# Patient Record
Sex: Male | Born: 1955 | Race: White | Hispanic: No | Marital: Single | Smoking: Current every day smoker
Health system: Southern US, Community
[De-identification: ages and names within clinical notes are randomized; demographics above are authoritative.]

## PROBLEM LIST (undated history)

## (undated) DIAGNOSIS — F1123 Opioid dependence with withdrawal: Secondary | ICD-10-CM

## (undated) DIAGNOSIS — N200 Calculus of kidney: Secondary | ICD-10-CM

## (undated) DIAGNOSIS — F1110 Opioid abuse, uncomplicated: Secondary | ICD-10-CM

## (undated) DIAGNOSIS — F419 Anxiety disorder, unspecified: Secondary | ICD-10-CM

## (undated) DIAGNOSIS — I509 Heart failure, unspecified: Secondary | ICD-10-CM

## (undated) DIAGNOSIS — F32A Depression, unspecified: Secondary | ICD-10-CM

## (undated) DIAGNOSIS — N289 Disorder of kidney and ureter, unspecified: Secondary | ICD-10-CM

## (undated) DIAGNOSIS — F329 Major depressive disorder, single episode, unspecified: Secondary | ICD-10-CM

## (undated) DIAGNOSIS — F1010 Alcohol abuse, uncomplicated: Secondary | ICD-10-CM

## (undated) HISTORY — PX: OTHER SURGICAL HISTORY: SHX169

---

## 1998-12-21 ENCOUNTER — Encounter: Payer: Self-pay | Admitting: Emergency Medicine

## 1998-12-21 ENCOUNTER — Emergency Department (HOSPITAL_COMMUNITY): Admission: EM | Admit: 1998-12-21 | Discharge: 1998-12-21 | Payer: Self-pay | Admitting: Emergency Medicine

## 1999-01-12 ENCOUNTER — Emergency Department (HOSPITAL_COMMUNITY): Admission: EM | Admit: 1999-01-12 | Discharge: 1999-01-12 | Payer: Self-pay | Admitting: Emergency Medicine

## 1999-01-12 ENCOUNTER — Encounter: Payer: Self-pay | Admitting: Emergency Medicine

## 1999-01-26 ENCOUNTER — Encounter: Payer: Self-pay | Admitting: Emergency Medicine

## 1999-01-26 ENCOUNTER — Emergency Department (HOSPITAL_COMMUNITY): Admission: EM | Admit: 1999-01-26 | Discharge: 1999-01-26 | Payer: Self-pay | Admitting: Emergency Medicine

## 1999-12-01 ENCOUNTER — Encounter: Payer: Self-pay | Admitting: Emergency Medicine

## 1999-12-01 ENCOUNTER — Emergency Department (HOSPITAL_COMMUNITY): Admission: EM | Admit: 1999-12-01 | Discharge: 1999-12-01 | Payer: Self-pay | Admitting: Emergency Medicine

## 1999-12-15 ENCOUNTER — Emergency Department (HOSPITAL_COMMUNITY): Admission: EM | Admit: 1999-12-15 | Discharge: 1999-12-15 | Payer: Self-pay | Admitting: Emergency Medicine

## 1999-12-15 ENCOUNTER — Encounter: Payer: Self-pay | Admitting: Emergency Medicine

## 2000-10-23 ENCOUNTER — Emergency Department (HOSPITAL_COMMUNITY): Admission: EM | Admit: 2000-10-23 | Discharge: 2000-10-23 | Payer: Self-pay | Admitting: Emergency Medicine

## 2001-02-01 ENCOUNTER — Emergency Department (HOSPITAL_COMMUNITY): Admission: EM | Admit: 2001-02-01 | Discharge: 2001-02-01 | Payer: Self-pay | Admitting: Emergency Medicine

## 2001-06-12 ENCOUNTER — Emergency Department (HOSPITAL_COMMUNITY): Admission: EM | Admit: 2001-06-12 | Discharge: 2001-06-13 | Payer: Self-pay

## 2001-07-06 ENCOUNTER — Encounter: Payer: Self-pay | Admitting: Emergency Medicine

## 2001-07-06 ENCOUNTER — Emergency Department (HOSPITAL_COMMUNITY): Admission: EM | Admit: 2001-07-06 | Discharge: 2001-07-06 | Payer: Self-pay | Admitting: Emergency Medicine

## 2006-09-25 ENCOUNTER — Emergency Department (HOSPITAL_COMMUNITY): Admission: EM | Admit: 2006-09-25 | Discharge: 2006-09-25 | Payer: Self-pay | Admitting: Emergency Medicine

## 2006-10-08 ENCOUNTER — Emergency Department (HOSPITAL_COMMUNITY): Admission: EM | Admit: 2006-10-08 | Discharge: 2006-10-09 | Payer: Self-pay | Admitting: Emergency Medicine

## 2006-10-14 ENCOUNTER — Emergency Department (HOSPITAL_COMMUNITY): Admission: EM | Admit: 2006-10-14 | Discharge: 2006-10-14 | Payer: Self-pay | Admitting: Emergency Medicine

## 2006-10-20 ENCOUNTER — Emergency Department (HOSPITAL_COMMUNITY): Admission: EM | Admit: 2006-10-20 | Discharge: 2006-10-20 | Payer: Self-pay | Admitting: Emergency Medicine

## 2006-11-28 ENCOUNTER — Emergency Department (HOSPITAL_COMMUNITY): Admission: EM | Admit: 2006-11-28 | Discharge: 2006-11-29 | Payer: Self-pay | Admitting: Emergency Medicine

## 2007-03-03 ENCOUNTER — Emergency Department (HOSPITAL_COMMUNITY): Admission: EM | Admit: 2007-03-03 | Discharge: 2007-03-04 | Payer: Self-pay | Admitting: *Deleted

## 2007-03-24 ENCOUNTER — Emergency Department (HOSPITAL_COMMUNITY): Admission: EM | Admit: 2007-03-24 | Discharge: 2007-03-24 | Payer: Self-pay | Admitting: Emergency Medicine

## 2007-03-29 ENCOUNTER — Emergency Department (HOSPITAL_COMMUNITY): Admission: EM | Admit: 2007-03-29 | Discharge: 2007-03-29 | Payer: Self-pay | Admitting: Internal Medicine

## 2007-03-31 ENCOUNTER — Emergency Department (HOSPITAL_COMMUNITY): Admission: EM | Admit: 2007-03-31 | Discharge: 2007-04-01 | Payer: Self-pay | Admitting: Emergency Medicine

## 2007-04-25 ENCOUNTER — Emergency Department (HOSPITAL_COMMUNITY): Admission: EM | Admit: 2007-04-25 | Discharge: 2007-04-25 | Payer: Self-pay | Admitting: Emergency Medicine

## 2007-07-20 ENCOUNTER — Emergency Department (HOSPITAL_COMMUNITY): Admission: EM | Admit: 2007-07-20 | Discharge: 2007-07-20 | Payer: Self-pay | Admitting: Emergency Medicine

## 2007-07-28 ENCOUNTER — Emergency Department (HOSPITAL_COMMUNITY): Admission: EM | Admit: 2007-07-28 | Discharge: 2007-07-29 | Payer: Self-pay | Admitting: Emergency Medicine

## 2007-09-02 ENCOUNTER — Emergency Department (HOSPITAL_COMMUNITY): Admission: EM | Admit: 2007-09-02 | Discharge: 2007-09-02 | Payer: Self-pay | Admitting: Emergency Medicine

## 2007-09-07 ENCOUNTER — Emergency Department: Payer: Self-pay | Admitting: Emergency Medicine

## 2007-09-11 ENCOUNTER — Emergency Department (HOSPITAL_COMMUNITY): Admission: EM | Admit: 2007-09-11 | Discharge: 2007-09-11 | Payer: Self-pay | Admitting: Emergency Medicine

## 2007-10-11 ENCOUNTER — Emergency Department (HOSPITAL_COMMUNITY): Admission: EM | Admit: 2007-10-11 | Discharge: 2007-10-11 | Payer: Self-pay | Admitting: Emergency Medicine

## 2011-05-29 ENCOUNTER — Emergency Department (HOSPITAL_COMMUNITY)
Admission: EM | Admit: 2011-05-29 | Discharge: 2011-05-29 | Disposition: A | Discharge status: Home / Self Care | Payer: Self-pay | Attending: Emergency Medicine | Admitting: Emergency Medicine

## 2011-05-29 ENCOUNTER — Emergency Department (HOSPITAL_COMMUNITY): Payer: Self-pay

## 2011-05-29 DIAGNOSIS — X500XXA Overexertion from strenuous movement or load, initial encounter: Secondary | ICD-10-CM | POA: Insufficient documentation

## 2011-05-29 DIAGNOSIS — M25579 Pain in unspecified ankle and joints of unspecified foot: Secondary | ICD-10-CM | POA: Insufficient documentation

## 2011-05-29 DIAGNOSIS — S99919A Unspecified injury of unspecified ankle, initial encounter: Secondary | ICD-10-CM | POA: Insufficient documentation

## 2011-05-29 DIAGNOSIS — S8990XA Unspecified injury of unspecified lower leg, initial encounter: Secondary | ICD-10-CM | POA: Insufficient documentation

## 2011-05-29 DIAGNOSIS — M25476 Effusion, unspecified foot: Secondary | ICD-10-CM | POA: Insufficient documentation

## 2011-05-29 DIAGNOSIS — S93409A Sprain of unspecified ligament of unspecified ankle, initial encounter: Secondary | ICD-10-CM | POA: Insufficient documentation

## 2011-05-29 DIAGNOSIS — M25473 Effusion, unspecified ankle: Secondary | ICD-10-CM | POA: Insufficient documentation

## 2011-06-21 LAB — DIFFERENTIAL
Eosinophils Absolute: 0.2
Monocytes Absolute: 0.9
Neutro Abs: 6.1
Neutrophils Relative %: 62

## 2011-06-21 LAB — I-STAT 8, (EC8 V) (CONVERTED LAB)
Acid-Base Excess: 1
BUN: 16
Glucose, Bld: 100 — ABNORMAL HIGH
Operator id: 295021
Sodium: 137
TCO2: 25
pCO2, Ven: 33.8 — ABNORMAL LOW
pH, Ven: 7.463 — ABNORMAL HIGH

## 2011-06-21 LAB — URINALYSIS, ROUTINE W REFLEX MICROSCOPIC
Glucose, UA: NEGATIVE
Nitrite: NEGATIVE
Protein, ur: NEGATIVE
Urobilinogen, UA: 0.2
pH: 5.5

## 2011-06-21 LAB — CBC
MCV: 92.4
Platelets: 296
RBC: 4.59

## 2011-06-21 LAB — POCT I-STAT CREATININE
Creatinine, Ser: 0.8
Operator id: 295021

## 2011-06-25 LAB — URINALYSIS, ROUTINE W REFLEX MICROSCOPIC
Leukocytes, UA: NEGATIVE
Nitrite: NEGATIVE
Protein, ur: NEGATIVE
Urobilinogen, UA: 0.2

## 2011-06-25 LAB — CBC
HCT: 42.5
Hemoglobin: 14.1
MCV: 92.7
RDW: 13.6

## 2011-06-25 LAB — COMPREHENSIVE METABOLIC PANEL
Alkaline Phosphatase: 69
BUN: 14
Chloride: 105
Creatinine, Ser: 0.73
Glucose, Bld: 112 — ABNORMAL HIGH
Potassium: 3.7
Total Bilirubin: 0.7
Total Protein: 6.6

## 2011-06-25 LAB — DIFFERENTIAL
Basophils Absolute: 0
Basophils Relative: 1
Lymphocytes Relative: 35
Neutro Abs: 4.3
Neutrophils Relative %: 56

## 2011-06-25 LAB — URINE MICROSCOPIC-ADD ON

## 2011-06-30 ENCOUNTER — Emergency Department (HOSPITAL_COMMUNITY)
Admission: EM | Admit: 2011-06-30 | Discharge: 2011-06-30 | Disposition: A | Discharge status: Home / Self Care | Payer: Self-pay | Attending: Emergency Medicine | Admitting: Emergency Medicine

## 2011-06-30 ENCOUNTER — Emergency Department (HOSPITAL_COMMUNITY): Payer: Self-pay

## 2011-06-30 DIAGNOSIS — N200 Calculus of kidney: Secondary | ICD-10-CM | POA: Insufficient documentation

## 2011-06-30 DIAGNOSIS — F17200 Nicotine dependence, unspecified, uncomplicated: Secondary | ICD-10-CM | POA: Insufficient documentation

## 2011-06-30 LAB — URINALYSIS, ROUTINE W REFLEX MICROSCOPIC
Bilirubin Urine: NEGATIVE
Ketones, ur: NEGATIVE mg/dL
Nitrite: NEGATIVE
Protein, ur: NEGATIVE mg/dL
Urobilinogen, UA: 0.2 mg/dL (ref 0.0–1.0)

## 2011-06-30 LAB — POCT I-STAT, CHEM 8
Calcium, Ion: 1.14 mmol/L (ref 1.12–1.32)
Chloride: 103 mEq/L (ref 96–112)
Glucose, Bld: 106 mg/dL — ABNORMAL HIGH (ref 70–99)
HCT: 45 % (ref 39.0–52.0)
Hemoglobin: 15.3 g/dL (ref 13.0–17.0)

## 2011-07-01 LAB — CBC
Platelets: 296
RBC: 4.93
WBC: 8.9

## 2011-07-01 LAB — URINALYSIS, ROUTINE W REFLEX MICROSCOPIC
Glucose, UA: NEGATIVE
Hgb urine dipstick: NEGATIVE
Protein, ur: NEGATIVE
Protein, ur: NEGATIVE
Specific Gravity, Urine: 1.007
Urobilinogen, UA: 1
pH: 7
pH: 6.5

## 2011-07-01 LAB — DIFFERENTIAL
Lymphocytes Relative: 17
Lymphs Abs: 1.5
Monocytes Relative: 4
Neutro Abs: 7
Neutrophils Relative %: 79 — ABNORMAL HIGH

## 2011-07-01 LAB — BASIC METABOLIC PANEL
BUN: 7
Chloride: 103
Creatinine, Ser: 0.71
GFR calc Af Amer: >60
GFR calc non Af Amer: >60

## 2011-07-01 LAB — URINE MICROSCOPIC-ADD ON: Urine-Other: NONE SEEN

## 2011-07-02 LAB — CBC
HCT: 45.6
Hemoglobin: 15.3
Hemoglobin: 15.6
MCHC: 33.4
MCV: 91.6
Platelets: 313
RBC: 5
RBC: 5.11
WBC: 10

## 2011-07-02 LAB — URINALYSIS, ROUTINE W REFLEX MICROSCOPIC
Bilirubin Urine: NEGATIVE
Bilirubin Urine: NEGATIVE
Glucose, UA: NEGATIVE
Hgb urine dipstick: NEGATIVE
Ketones, ur: NEGATIVE
Protein, ur: NEGATIVE
Protein, ur: NEGATIVE
Urobilinogen, UA: 0.2

## 2011-07-02 LAB — COMPREHENSIVE METABOLIC PANEL
ALT: 64 — ABNORMAL HIGH
Albumin: 4.5
Alkaline Phosphatase: 75
BUN: 12
CO2: 23
CO2: 24
Calcium: 9.8
Chloride: 103
Creatinine, Ser: 0.81
GFR calc non Af Amer: >60
GFR calc non Af Amer: >60
Glucose, Bld: 150 — ABNORMAL HIGH
Potassium: 4.8
Sodium: 136
Total Bilirubin: 0.9

## 2011-07-02 LAB — DIFFERENTIAL
Basophils Absolute: 0
Basophils Relative: 0
Eosinophils Absolute: 0.3
Eosinophils Relative: 2
Lymphs Abs: 2
Monocytes Absolute: 0.4
Neutro Abs: 8.2 — ABNORMAL HIGH
Neutrophils Relative %: 67

## 2011-07-03 LAB — URINALYSIS, ROUTINE W REFLEX MICROSCOPIC
Glucose, UA: NEGATIVE
Nitrite: NEGATIVE
Protein, ur: NEGATIVE
Urobilinogen, UA: 1

## 2011-07-14 ENCOUNTER — Observation Stay (HOSPITAL_COMMUNITY)
Admission: EM | Admit: 2011-07-14 | Discharge: 2011-07-14 | Disposition: A | Discharge status: Home / Self Care | Payer: Self-pay | Attending: Emergency Medicine | Admitting: Emergency Medicine

## 2011-07-14 ENCOUNTER — Emergency Department (HOSPITAL_COMMUNITY): Payer: Self-pay

## 2011-07-14 DIAGNOSIS — N23 Unspecified renal colic: Principal | ICD-10-CM | POA: Insufficient documentation

## 2011-07-14 LAB — POCT I-STAT, CHEM 8
HCT: 42 % (ref 39.0–52.0)
Hemoglobin: 14.3 g/dL (ref 13.0–17.0)
Potassium: 3.8 mEq/L (ref 3.5–5.1)
Sodium: 140 mEq/L (ref 135–145)

## 2011-07-14 LAB — URINALYSIS, ROUTINE W REFLEX MICROSCOPIC
Nitrite: NEGATIVE
Specific Gravity, Urine: 1.027 (ref 1.005–1.030)
Urobilinogen, UA: 1 mg/dL (ref 0.0–1.0)

## 2011-07-15 ENCOUNTER — Emergency Department: Payer: Self-pay | Admitting: Unknown Physician Specialty

## 2011-08-18 ENCOUNTER — Emergency Department (HOSPITAL_COMMUNITY)
Admission: EM | Admit: 2011-08-18 | Discharge: 2011-08-18 | Disposition: A | Discharge status: Home / Self Care | Payer: Self-pay | Attending: Emergency Medicine | Admitting: Emergency Medicine

## 2011-08-18 DIAGNOSIS — Z87442 Personal history of urinary calculi: Secondary | ICD-10-CM | POA: Insufficient documentation

## 2011-08-18 DIAGNOSIS — R109 Unspecified abdominal pain: Secondary | ICD-10-CM | POA: Insufficient documentation

## 2011-08-18 DIAGNOSIS — R6883 Chills (without fever): Secondary | ICD-10-CM | POA: Insufficient documentation

## 2011-08-18 DIAGNOSIS — R112 Nausea with vomiting, unspecified: Secondary | ICD-10-CM | POA: Insufficient documentation

## 2011-08-18 DIAGNOSIS — R10819 Abdominal tenderness, unspecified site: Secondary | ICD-10-CM | POA: Insufficient documentation

## 2011-08-18 HISTORY — DX: Disorder of kidney and ureter, unspecified: N28.9

## 2011-08-18 LAB — URINALYSIS, ROUTINE W REFLEX MICROSCOPIC
Hgb urine dipstick: NEGATIVE
Leukocytes, UA: NEGATIVE
Protein, ur: NEGATIVE mg/dL
Urobilinogen, UA: 0.2 mg/dL (ref 0.0–1.0)

## 2011-08-18 MED ORDER — ONDANSETRON HCL 4 MG/2ML IJ SOLN
4.0000 mg | Freq: Once | INTRAMUSCULAR | Status: AC
  Administered 2011-08-18: 4 mg via INTRAVENOUS

## 2011-08-18 MED ORDER — SODIUM CHLORIDE 0.9 % IV BOLUS (SEPSIS)
250.0000 mL | Freq: Once | INTRAVENOUS | Status: AC
  Administered 2011-08-18: 250 mL via INTRAVENOUS

## 2011-08-18 MED ORDER — OXYCODONE-ACETAMINOPHEN 5-325 MG PO TABS: 1.0000 | ORAL_TABLET | Freq: Four times a day (QID) | ORAL | Status: AC | PRN

## 2011-08-18 MED ORDER — ONDANSETRON HCL 4 MG PO TABS: 4.0000 mg | ORAL_TABLET | Freq: Four times a day (QID) | ORAL | Status: AC

## 2011-08-18 MED ORDER — ONDANSETRON HCL 4 MG/2ML IJ SOLN
4.0000 mg | Freq: Once | INTRAMUSCULAR | Status: DC
  Filled 2011-08-18: qty 2

## 2011-08-18 MED ORDER — HYDROMORPHONE HCL PF 1 MG/ML IJ SOLN: 1.0000 mg | Freq: Once | INTRAMUSCULAR | Status: DC

## 2011-08-18 MED ORDER — HYDROMORPHONE HCL PF 2 MG/ML IJ SOLN
INTRAMUSCULAR | Status: AC
  Filled 2011-08-18: qty 1

## 2011-08-18 MED ORDER — HYDROMORPHONE HCL PF 2 MG/ML IJ SOLN
INTRAMUSCULAR | Status: AC
  Administered 2011-08-18: 1 mg via INTRAVENOUS
  Filled 2011-08-18: qty 1

## 2011-08-18 MED ORDER — KETOROLAC TROMETHAMINE 30 MG/ML IJ SOLN
30.0000 mg | Freq: Once | INTRAMUSCULAR | Status: AC
  Administered 2011-08-18: 30 mg via INTRAVENOUS
  Filled 2011-08-18: qty 1

## 2011-08-18 MED ORDER — OXYCODONE-ACETAMINOPHEN 5-325 MG PO TABS
2.0000 | ORAL_TABLET | Freq: Once | ORAL | Status: AC
  Administered 2011-08-18: 2 via ORAL
  Filled 2011-08-18: qty 2

## 2011-08-18 MED ORDER — ONDANSETRON 4 MG PO TBDP
4.0000 mg | ORAL_TABLET | Freq: Once | ORAL | Status: AC
  Administered 2011-08-18: 4 mg via ORAL
  Filled 2011-08-18: qty 1

## 2011-08-18 MED ORDER — HYDROMORPHONE HCL PF 2 MG/ML IJ SOLN
2.0000 mg | Freq: Once | INTRAMUSCULAR | Status: AC
  Administered 2011-08-18: 1 mg via INTRAVENOUS
  Administered 2011-08-18: 2 mg via INTRAVENOUS

## 2011-08-18 NOTE — ED Provider Notes (Signed)
Medical screening examination/treatment/procedure(s) were performed by non-physician practitioner and as supervising physician I was immediately available for consultation/collaboration.   Adalaya Irion R Casin Federici, MD 08/18/11 2339 

## 2011-08-18 NOTE — ED Notes (Signed)
Pt in from home with right flank pain hx of kidney stones states pain radiates to the right testicle states difficulty urinating

## 2011-08-18 NOTE — ED Provider Notes (Signed)
History     CSN: [id number] Arrival date & time: 08/18/2011  5:00 PM   First MD Initiated Contact with Patient 08/18/11 1915      Chief Complaint  Patient presents with  . Flank Pain    (Consider location/radiation/quality/duration/timing/severity/associated sxs/prior treatment) Patient is a 55 y.o. male presenting with flank pain.  Flank Pain This is a recurrent problem. The current episode started yesterday. The problem occurs constantly. The problem has been gradually worsening. Associated symptoms include chills, nausea, urinary symptoms and vomiting. Pertinent negatives include no abdominal pain, anorexia, arthralgias, change in bowel habit, chest pain, congestion, coughing, diaphoresis, fatigue, fever, headaches, joint swelling, myalgias, neck pain, numbness, rash, sore throat, swollen glands, vertigo, visual change or weakness. The symptoms are aggravated by nothing.    Pt has a past history of kidney stones. He has had multiple scans in the past for the same. He states that he has been having right sided flank pain that is radiating to his left testicle with vomiting. Pain started last night however, after church today it got very severe. Pt states that he normally is able to go home when he has kidney stones.  Past Medical History  Diagnosis Date  . Renal disorder     History reviewed. No pertinent past surgical history.  History reviewed. No pertinent family history.  History  Substance Use Topics  . Smoking status: Never Smoker   . Smokeless tobacco: Not on file  . Alcohol Use: No      Review of Systems  Constitutional: Positive for chills. Negative for fever, diaphoresis and fatigue.  HENT: Negative for congestion, sore throat and neck pain.   Respiratory: Negative for cough.   Cardiovascular: Negative for chest pain.  Gastrointestinal: Positive for nausea and vomiting. Negative for abdominal pain, anorexia and change in bowel habit.  Genitourinary: Positive  for flank pain.  Musculoskeletal: Negative for myalgias, joint swelling and arthralgias.  Skin: Negative for rash.  Neurological: Negative for vertigo, weakness, numbness and headaches.    Allergies  Review of patient's allergies indicates no known allergies.  Home Medications   Current Outpatient Rx  Name Route Sig Dispense Refill  . IBUPROFEN 800 MG PO TABS Oral Take 1,600 mg by mouth every 8 (eight) hours as needed. Pain.     Marland Kitchen VITAMIN A 57846 UNITS PO CAPS Oral Take 10,000 Units by mouth daily.      Marland Kitchen VITAMIN E 400 UNITS PO CAPS Oral Take 400 Units by mouth daily.        BP 139/79  Pulse 93  Temp(Src) 98 F (36.7 C) (Oral)  Resp 20  SpO2 96%  Physical Exam  Nursing note and vitals reviewed. Constitutional: He is oriented to person, place, and time. He appears well-developed and well-nourished.  HENT:  Head: Normocephalic and atraumatic.  Eyes: EOM are normal. Pupils are equal, round, and reactive to light.  Neck: Normal range of motion.  Cardiovascular: Normal rate and regular rhythm.   Pulmonary/Chest: Effort normal and breath sounds normal.  Abdominal: Soft. Bowel sounds are normal. There is tenderness. There is guarding and CVA tenderness.    Musculoskeletal: Normal range of motion.  Neurological: He is alert and oriented to person, place, and time.  Skin: Skin is warm and dry.    ED Course  Procedures (including critical care time)  Labs Reviewed  URINALYSIS, ROUTINE W REFLEX MICROSCOPIC - Abnormal; Notable for the following:    APPearance CLOUDY (*)    All other components within normal  limits   No results found.   No diagnosis found.    MDM   Pt is questionably seeking narcotics. He rests comfortably until he sees me come in the room then starts writhing in pain and asking for more narcotics. Will D/C, pt does not argue and feels comfortable going home pain level is now down to a 4/10.       Dorthula Matas, PA 08/18/11 2230

## 2011-08-18 NOTE — ED Notes (Signed)
Rx given to pt. 

## 2011-09-07 ENCOUNTER — Encounter (HOSPITAL_COMMUNITY): Payer: Self-pay | Admitting: *Deleted

## 2011-09-07 ENCOUNTER — Emergency Department (HOSPITAL_COMMUNITY): Payer: Self-pay

## 2011-09-07 ENCOUNTER — Emergency Department (HOSPITAL_COMMUNITY)
Admission: EM | Admit: 2011-09-07 | Discharge: 2011-09-07 | Disposition: A | Discharge status: Home / Self Care | Payer: Self-pay | Attending: Emergency Medicine | Admitting: Emergency Medicine

## 2011-09-07 DIAGNOSIS — Z9889 Other specified postprocedural states: Secondary | ICD-10-CM | POA: Insufficient documentation

## 2011-09-07 DIAGNOSIS — S93409A Sprain of unspecified ligament of unspecified ankle, initial encounter: Secondary | ICD-10-CM | POA: Insufficient documentation

## 2011-09-07 DIAGNOSIS — W108XXA Fall (on) (from) other stairs and steps, initial encounter: Secondary | ICD-10-CM | POA: Insufficient documentation

## 2011-09-07 DIAGNOSIS — W19XXXA Unspecified fall, initial encounter: Secondary | ICD-10-CM

## 2011-09-07 DIAGNOSIS — S93402A Sprain of unspecified ligament of left ankle, initial encounter: Secondary | ICD-10-CM

## 2011-09-07 MED ORDER — OXYCODONE-ACETAMINOPHEN 5-325 MG PO TABS
1.0000 | ORAL_TABLET | Freq: Once | ORAL | Status: AC
  Administered 2011-09-07: 1 via ORAL
  Filled 2011-09-07: qty 1

## 2011-09-07 MED ORDER — IBUPROFEN 800 MG PO TABS: 800.0000 mg | ORAL_TABLET | Freq: Three times a day (TID) | ORAL | Status: AC

## 2011-09-07 MED ORDER — OXYCODONE-ACETAMINOPHEN 5-325 MG PO TABS: 1.0000 | ORAL_TABLET | ORAL | Status: AC | PRN

## 2011-09-07 NOTE — ED Provider Notes (Signed)
History     CSN: [id number]  Arrival date & time 09/07/11  1306   First MD Initiated Contact with Patient 09/07/11 1435      Chief Complaint  Patient presents with  . Fall    L ankle injury    (Consider location/radiation/quality/duration/timing/severity/associated sxs/prior treatment) Patient is a 55 y.o. male presenting with fall. The history is provided by the patient.  Fall The accident occurred 3 to 5 hours ago. Incident: waling down stairs. He landed on a hard floor. The point of impact was the head (left ankle). Pain location: left ankle. The pain is at a severity of 9/10. The pain is moderate. He was not ambulatory at the scene. Pertinent negatives include no numbness and no headaches. The symptoms are aggravated by activity, standing, ambulation and pressure on the injury.  Pt states he missed a step and fell down two steps, hitting his head and twisting left ankle. Deneis LOC. Denies headache, nausea, vomiting, focal neurological symptoms. States only pain is in left ankle. States had previous fracture and surgery on that ankle a year ago. Unable to bear weight. Ibuprofen taken prior to arrival.  Past Medical History  Diagnosis Date  . Renal disorder     Past Surgical History  Procedure Date  . L ankle surgery     Plates and screws placed last October at [facility]  . Reconstructive surgery r great toe     Family History  Problem Relation Age of Onset  . Heart failure Mother     History  Substance Use Topics  . Smoking status: Never Smoker   . Smokeless tobacco: Never Used  . Alcohol Use: No      Review of Systems  Constitutional: Negative.   HENT: Negative.   Respiratory: Negative.   Cardiovascular: Negative.   Gastrointestinal: Negative.   Genitourinary: Negative.   Musculoskeletal: Positive for joint swelling.  Neurological: Negative for dizziness, weakness, light-headedness, numbness and headaches.  Psychiatric/Behavioral: Negative.      Allergies  Review of patient's allergies indicates no known allergies.  Home Medications   Current Outpatient Rx  Name Route Sig Dispense Refill  . IBUPROFEN 800 MG PO TABS Oral Take 1,600 mg by mouth every 8 (eight) hours as needed. Pain.     Marland Kitchen VITAMIN A 40981 UNITS PO CAPS Oral Take 10,000 Units by mouth daily.      Marland Kitchen VITAMIN E 400 UNITS PO CAPS Oral Take 400 Units by mouth daily.        BP 152/95  Temp(Src) 98.5 F (36.9 C) (Oral)  Resp 18  SpO2 97%  Physical Exam  Constitutional: He is oriented to person, place, and time. He appears well-developed and well-nourished. No distress.  HENT:  Head: Normocephalic and atraumatic.  Neck: Neck supple.  Cardiovascular: Normal rate and regular rhythm.   Pulmonary/Chest: Effort normal and breath sounds normal. No respiratory distress.  Musculoskeletal: He exhibits tenderness.       Left ankle swelling over medial and lateral malleolus. Old healed surgical scar over medial and lateral malleoli. Tender to palpation over both medial and lateral malleoli. Achillis tendon intact. Joint stable. Pain with ankle flexion, extension, inversion, eversion. Normal knee and foot exam.  Neurological: He is alert and oriented to person, place, and time.  Skin: Skin is warm and dry. No erythema.  Psychiatric: He has a normal mood and affect.    ED Course  Procedures (including critical care time)  Labs Reviewed - No data to display Dg  Ankle Complete Left  09/07/2011  *RADIOLOGY REPORT*  Clinical Data: Twisting injury, pain.  Remote injury.  LEFT ANKLE COMPLETE - 3+ VIEW  Comparison: 05/29/2011  Findings: Hardware within the distal fibula and medial malleolus/distal tibia are unchanged.  No hardware complicating feature.  No acute fracture, subluxation or dislocation.  Soft tissues are intact.  IMPRESSION: No acute findings.  Stable post-traumatic/postoperative changes.  Original Report Authenticated By: Cyndie Chime, M.D.    Pt with fall, hit  head, however, no signs of major head injury. Pain only to left ankle. Hx of previous fracture and surgical repair. X-ray negative. Pt has ASO at home and his own crutches. Ankle is swollen, suspect sprain. Will start on anti inflammatories, pain medications, follow up with orthopedics.    MDM          Lottie Mussel, PA 09/07/11 1528

## 2011-09-08 NOTE — ED Provider Notes (Signed)
Medical screening examination/treatment/procedure(s) were performed by non-physician practitioner and as supervising physician I was immediately available for consultation/collaboration.   Lyanne Co, MD 09/08/11 0001

## 2011-09-10 ENCOUNTER — Emergency Department (HOSPITAL_COMMUNITY): Payer: Self-pay

## 2011-09-10 ENCOUNTER — Emergency Department (HOSPITAL_COMMUNITY)
Admission: EM | Admit: 2011-09-10 | Discharge: 2011-09-10 | Disposition: A | Discharge status: Home / Self Care | Payer: Self-pay | Attending: Emergency Medicine | Admitting: Emergency Medicine

## 2011-09-10 ENCOUNTER — Encounter (HOSPITAL_COMMUNITY): Payer: Self-pay | Admitting: Emergency Medicine

## 2011-09-10 DIAGNOSIS — N200 Calculus of kidney: Secondary | ICD-10-CM | POA: Insufficient documentation

## 2011-09-10 DIAGNOSIS — R109 Unspecified abdominal pain: Secondary | ICD-10-CM | POA: Insufficient documentation

## 2011-09-10 DIAGNOSIS — R112 Nausea with vomiting, unspecified: Secondary | ICD-10-CM | POA: Insufficient documentation

## 2011-09-10 DIAGNOSIS — R300 Dysuria: Secondary | ICD-10-CM | POA: Insufficient documentation

## 2011-09-10 HISTORY — DX: Calculus of kidney: N20.0

## 2011-09-10 LAB — URINALYSIS, ROUTINE W REFLEX MICROSCOPIC
Glucose, UA: NEGATIVE mg/dL
Ketones, ur: NEGATIVE mg/dL
Leukocytes, UA: NEGATIVE
Protein, ur: NEGATIVE mg/dL

## 2011-09-10 MED ORDER — HYDROCODONE-ACETAMINOPHEN 5-325 MG PO TABS: 1.0000 | ORAL_TABLET | Freq: Four times a day (QID) | ORAL | Status: AC | PRN

## 2011-09-10 MED ORDER — ONDANSETRON 8 MG PO TBDP: 8.0000 mg | ORAL_TABLET | Freq: Three times a day (TID) | ORAL | Status: AC | PRN

## 2011-09-10 MED ORDER — KETOROLAC TROMETHAMINE 30 MG/ML IJ SOLN
INTRAMUSCULAR | Status: AC
  Administered 2011-09-10: 30 mg via INTRAVENOUS
  Filled 2011-09-10: qty 1

## 2011-09-10 MED ORDER — SODIUM CHLORIDE 0.9 % IV BOLUS (SEPSIS)
1000.0000 mL | Freq: Once | INTRAVENOUS | Status: AC
  Administered 2011-09-10: 1000 mL via INTRAVENOUS

## 2011-09-10 MED ORDER — ONDANSETRON HCL 4 MG/2ML IJ SOLN
INTRAMUSCULAR | Status: AC
  Administered 2011-09-10: 4 mg via INTRAVENOUS
  Filled 2011-09-10: qty 2

## 2011-09-10 MED ORDER — PROMETHAZINE HCL 25 MG PO TABS: 25.0000 mg | ORAL_TABLET | Freq: Four times a day (QID) | ORAL | Status: AC | PRN

## 2011-09-10 MED ORDER — HYDROMORPHONE HCL PF 1 MG/ML IJ SOLN
1.0000 mg | Freq: Once | INTRAMUSCULAR | Status: AC
  Administered 2011-09-10: 1 mg via INTRAVENOUS

## 2011-09-10 MED ORDER — ONDANSETRON HCL 4 MG/2ML IJ SOLN
4.0000 mg | Freq: Once | INTRAMUSCULAR | Status: AC
  Administered 2011-09-10: 4 mg via INTRAVENOUS

## 2011-09-10 MED ORDER — ONDANSETRON HCL 4 MG/2ML IJ SOLN
4.0000 mg | Freq: Once | INTRAMUSCULAR | Status: AC
  Administered 2011-09-10: 4 mg via INTRAVENOUS
  Filled 2011-09-10: qty 2

## 2011-09-10 MED ORDER — KETOROLAC TROMETHAMINE 30 MG/ML IJ SOLN
30.0000 mg | Freq: Once | INTRAMUSCULAR | Status: AC
  Administered 2011-09-10: 30 mg via INTRAVENOUS

## 2011-09-10 MED ORDER — HYDROMORPHONE HCL PF 1 MG/ML IJ SOLN
1.0000 mg | Freq: Once | INTRAMUSCULAR | Status: AC
  Administered 2011-09-10: 1 mg via INTRAVENOUS
  Filled 2011-09-10: qty 1

## 2011-09-10 MED ORDER — HYDROMORPHONE HCL PF 1 MG/ML IJ SOLN
INTRAMUSCULAR | Status: AC
  Administered 2011-09-10: 1 mg via INTRAVENOUS
  Filled 2011-09-10: qty 1

## 2011-09-10 NOTE — ED Notes (Signed)
Into room to assess patient. Resting in bed on right side rocking back and forth. States he started having right flank pain that radiates around to abdomen and into right groin since 1300 today. States he tried taking motrin earlier with no relieve. Has had several episodes of vomiting today. States last food\ fluid intake was yesterday evening. Last BM was today and normal. Burns on urination. Active bowel sounds in all quadrants. Abdomen soft. Tender on right side.

## 2011-09-10 NOTE — ED Notes (Signed)
Dr Cook at bedside

## 2011-09-10 NOTE — ED Notes (Signed)
Into room to attempt IV access.

## 2011-09-10 NOTE — ED Notes (Signed)
Pt has requested a drink. It has been explained to pt that he will need to wait until ct has completed.

## 2011-09-10 NOTE — ED Notes (Signed)
Report given to Tiana Loft, [place].

## 2011-09-10 NOTE — ED Notes (Signed)
Patient in a lot of pain

## 2011-09-10 NOTE — ED Provider Notes (Signed)
CT scan to evaluate flank pain and questionable 6 mm stone seen on KUB is negative no ureteral stones stable and unchanged bilateral stones up in the kidney area. Will treat patient for pain and nausea and vomiting.  Results for orders placed during the hospital encounter of 09/10/11  URINALYSIS, ROUTINE W REFLEX MICROSCOPIC      Component Value Range   Color, Urine YELLOW  YELLOW    APPearance CLEAR  CLEAR    Specific Gravity, Urine >1.030 (*) 1.005 - 1.030    pH 5.5  5.0 - 8.0    Glucose, UA NEGATIVE  NEGATIVE (mg/dL)   Hgb urine dipstick NEGATIVE  NEGATIVE    Bilirubin Urine NEGATIVE  NEGATIVE    Ketones, ur NEGATIVE  NEGATIVE (mg/dL)   Protein, ur NEGATIVE  NEGATIVE (mg/dL)   Urobilinogen, UA 0.2  0.0 - 1.0 (mg/dL)   Nitrite NEGATIVE  NEGATIVE    Leukocytes, UA NEGATIVE  NEGATIVE    Results for orders placed during the hospital encounter of 09/10/11  URINALYSIS, ROUTINE W REFLEX MICROSCOPIC      Component Value Range   Color, Urine YELLOW  YELLOW    APPearance CLEAR  CLEAR    Specific Gravity, Urine >1.030 (*) 1.005 - 1.030    pH 5.5  5.0 - 8.0    Glucose, UA NEGATIVE  NEGATIVE (mg/dL)   Hgb urine dipstick NEGATIVE  NEGATIVE    Bilirubin Urine NEGATIVE  NEGATIVE    Ketones, ur NEGATIVE  NEGATIVE (mg/dL)   Protein, ur NEGATIVE  NEGATIVE (mg/dL)   Urobilinogen, UA 0.2  0.0 - 1.0 (mg/dL)   Nitrite NEGATIVE  NEGATIVE    Leukocytes, UA NEGATIVE  NEGATIVE    Ct Abdomen Pelvis Wo Contrast  09/10/2011  *RADIOLOGY REPORT*  Clinical Data: Right-sided flank pain, history kidney stones  CT ABDOMEN AND PELVIS WITHOUT CONTRAST  Technique:  Multidetector CT imaging of the abdomen and pelvis was performed following the standard protocol without intravenous contrast.  Comparison: CT abdomen pelvis of 07/14/2011  Findings: The lung bases are clear.  The liver is stable with a rounded low attenuation lesion remains stable in the posterior right lobe.  No ductal dilatation is seen.  No calcified  gallstones are noted.  The pancreas is normal in size and the pancreatic duct is not dilated.  The adrenal glands and spleen are unremarkable. The stomach is not well distended.  There is no change in a right upper pole calculus which is nonobstructing with a lower pole calculus also on the right.  No hydronephrosis is seen, with a tiny left renal calculus in the lower pole.  The abdominal aorta is normal in caliber.  No adenopathy is seen.  The ureters are normal in caliber and no distal ureteral calculus is seen.  The urinary bladder is unremarkable.  The prostate is normal in size.  No fluid is seen within the pelvis.  No abnormality of the colon is seen.  The appendix and terminal ileum are unremarkable.  No bony abnormality is seen.  IMPRESSION:  1.  No change in bilateral renal calculi.  No hydronephrosis. 2.  No distal ureteral calculi are noted. 3.  The appendix and terminal ileum appear normal. 4.  Stable rounded low attenuation lesion in the posterior right lobe of liver.  Original Report Authenticated By: Juline Patch, M.D.   Dg Ankle Complete Left  09/07/2011  *RADIOLOGY REPORT*  Clinical Data: Twisting injury, pain.  Remote injury.  LEFT ANKLE COMPLETE - 3+  VIEW  Comparison: 05/29/2011  Findings: Hardware within the distal fibula and medial malleolus/distal tibia are unchanged.  No hardware complicating feature.  No acute fracture, subluxation or dislocation.  Soft tissues are intact.  IMPRESSION: No acute findings.  Stable post-traumatic/postoperative changes.  Original Report Authenticated By: Cyndie Chime, M.D.   Dg Abd 1 View  09/10/2011  *RADIOLOGY REPORT*  Clinical Data: Right flank pain for 4 hours.  ABDOMEN - 1 VIEW  Comparison: Abdominal radiograph performed 09/25/2006, and CT of the abdomen and pelvis performed 07/14/2011  Findings: There is question of a 6 mm stone overlying the right renal pelvis.  A 6 mm stone is noted at the upper pole of the right kidney.  No additional stones  are characterized along the course of the right ureter.  The visualized bowel gas pattern is grossly unremarkable.  No free intra-abdominal air is seen, though evaluation for free air is suboptimal on a supine views.  No acute osseous abnormalities are identified.  IMPRESSION: Question of 6 mm stone overlying the right renal pelvis, which appears to have migrated medially since the prior study.  6 mm stone also noted at the upper pole of the right kidney.  Original Report Authenticated By: Tonia Ghent, M.D.      Shelda Jakes, MD 09/10/11 [phone]

## 2011-09-10 NOTE — ED Provider Notes (Signed)
History     CSN: [id number]  Arrival date & time 09/10/11  0211   First MD Initiated Contact with Patient 09/10/11 0345      Chief Complaint  Patient presents with  . Flank Pain  . Emesis    (Consider location/radiation/quality/duration/timing/severity/associated sxs/prior treatment) HPI...Marland KitchenMarland Kitchenabrupt onset right flank pain approximately 1 hour ago with dysuria. No fever, chills, hematuria.  In radiates to right lower abdomen and genitalia. Patient has history of kidney stones. Nothing makes pain better or worse. cramping in nature.  Also complains of nausea and vomiting. Pain is constant  Past Medical History  Diagnosis Date  . Renal disorder   . Kidney stones     Past Surgical History  Procedure Date  . L ankle surgery     Plates and screws [facility] last October at [facility]  . Reconstructive surgery r great toe     Family History  Problem Relation Age of Onset  . Heart failure Mother     History  Substance Use Topics  . Smoking status: Never Smoker   . Smokeless tobacco: Never Used  . Alcohol Use: No      Review of Systems  All other systems reviewed and are negative.    Allergies  Review of patient's allergies indicates no known allergies.  Home Medications   Current Outpatient Rx  Name Route Sig Dispense Refill  . IBUPROFEN 800 MG PO TABS Oral Take 1,600 mg by mouth every 8 (eight) hours as needed. Pain.     . IBUPROFEN 800 MG PO TABS Oral Take 1 tablet (800 mg total) by mouth 3 (three) times daily. 21 tablet 0  . OXYCODONE-ACETAMINOPHEN 5-325 MG PO TABS Oral Take 1 tablet by mouth every 4 (four) hours as needed for pain. 20 tablet 0  . VITAMIN A 40981 UNITS PO CAPS Oral Take 10,000 Units by mouth daily.      Marland Kitchen VITAMIN E 400 UNITS PO CAPS Oral Take 400 Units by mouth daily.        BP 130/65  Pulse 74  Temp(Src) 97.6 F (36.4 C) (Oral)  Resp 20  Ht 5\' 10"  (1.778 m)  Wt 250 lb (113.399 kg)  BMI 35.87 kg/m2  SpO2 94%  Physical Exam  Nursing  note and vitals reviewed. Constitutional: He is oriented to person, place, and time. He appears well-developed and well-nourished.  HENT:  Head: Normocephalic and atraumatic.  Eyes: Conjunctivae and EOM are normal. Pupils are equal, round, and reactive to light.  Neck: Normal range of motion. Neck supple.  Cardiovascular: Normal rate and regular rhythm.   Pulmonary/Chest: Effort normal and breath sounds normal.  Abdominal: Soft. Bowel sounds are normal.  Genitourinary:       Tender right flank  Musculoskeletal: Normal range of motion.  Neurological: He is alert and oriented to person, place, and time.  Skin: Skin is warm and dry.  Psychiatric: He has a normal mood and affect.    ED Course  Procedures (including critical care time)  Labs Reviewed  URINALYSIS, ROUTINE W REFLEX MICROSCOPIC - Abnormal; Notable for the following:    Specific Gravity, Urine >1.030 (*)    All other components within normal limits   Dg Abd 1 View  09/10/2011  *RADIOLOGY REPORT*  Clinical Data: Right flank pain for 4 hours.  ABDOMEN - 1 VIEW  Comparison: Abdominal radiograph performed 09/25/2006, and CT of the abdomen and pelvis performed 07/14/2011  Findings: There is question of a 6 mm stone overlying the right  renal pelvis.  A 6 mm stone is noted at the upper pole of the right kidney.  No additional stones are characterized along the course of the right ureter.  The visualized bowel gas pattern is grossly unremarkable.  No free intra-abdominal air is seen, though evaluation for free air is suboptimal on a supine views.  No acute osseous abnormalities are identified.  IMPRESSION: Question of 6 mm stone overlying the right renal pelvis, which appears to have migrated medially since the prior study.  6 mm stone also noted at the upper pole of the right kidney.  Original Report Authenticated By: Tonia Ghent, M.D.     No diagnosis found.    MDM  KUB shows potential of 6 mm stone.  Still having pain at  0700.  Will obtain CT abdomen pelvis. pain management        Donnetta Hutching, MD 09/10/11 [phone]

## 2011-09-10 NOTE — ED Notes (Signed)
Patient complaining of right flank pain radiating into groin area. Started vomiting approximately 1 hour ago. Patient states he has a history of kidney stones.

## 2011-09-10 NOTE — ED Notes (Signed)
Helped patient up and to the rest room

## 2011-10-01 ENCOUNTER — Emergency Department (HOSPITAL_COMMUNITY): Payer: Self-pay

## 2011-10-01 ENCOUNTER — Encounter (HOSPITAL_COMMUNITY): Payer: Self-pay

## 2011-10-01 ENCOUNTER — Emergency Department (HOSPITAL_COMMUNITY)
Admission: EM | Admit: 2011-10-01 | Discharge: 2011-10-01 | Disposition: A | Discharge status: Home / Self Care | Payer: Self-pay | Attending: Emergency Medicine | Admitting: Emergency Medicine

## 2011-10-01 DIAGNOSIS — R109 Unspecified abdominal pain: Secondary | ICD-10-CM | POA: Insufficient documentation

## 2011-10-01 DIAGNOSIS — Z87442 Personal history of urinary calculi: Secondary | ICD-10-CM | POA: Insufficient documentation

## 2011-10-01 DIAGNOSIS — Z79899 Other long term (current) drug therapy: Secondary | ICD-10-CM | POA: Insufficient documentation

## 2011-10-01 DIAGNOSIS — E860 Dehydration: Secondary | ICD-10-CM

## 2011-10-01 LAB — COMPREHENSIVE METABOLIC PANEL
Albumin: 4.5 g/dL (ref 3.5–5.2)
Alkaline Phosphatase: 90 U/L (ref 39–117)
BUN: 14 mg/dL (ref 6–23)
Chloride: 100 mEq/L (ref 96–112)
Creatinine, Ser: 0.6 mg/dL (ref 0.50–1.35)
GFR calc Af Amer: >90 mL/min (ref >90)
GFR calc non Af Amer: >90 mL/min (ref >90)
Glucose, Bld: 102 mg/dL — ABNORMAL HIGH (ref 70–99)
Total Bilirubin: 0.8 mg/dL (ref 0.3–1.2)

## 2011-10-01 LAB — DIFFERENTIAL
Basophils Relative: 0 % (ref 0–1)
Lymphs Abs: 1.7 10*3/uL (ref 0.7–4.0)
Monocytes Absolute: 0.8 10*3/uL (ref 0.1–1.0)
Monocytes Relative: 8 % (ref 3–12)
Neutro Abs: 7.2 10*3/uL (ref 1.7–7.7)

## 2011-10-01 LAB — URINALYSIS, DIPSTICK ONLY
Glucose, UA: NEGATIVE mg/dL
Ketones, ur: >80 mg/dL — AB
Specific Gravity, Urine: 1.028 (ref 1.005–1.030)
pH: 5.5 (ref 5.0–8.0)

## 2011-10-01 LAB — CBC
HCT: 46.3 % (ref 39.0–52.0)
Hemoglobin: 16.2 g/dL (ref 13.0–17.0)
MCH: 30.1 pg (ref 26.0–34.0)
MCHC: 35 g/dL (ref 30.0–36.0)
RBC: 5.39 MIL/uL (ref 4.22–5.81)

## 2011-10-01 LAB — LIPASE, BLOOD: Lipase: 41 U/L (ref 11–59)

## 2011-10-01 MED ORDER — ONDANSETRON HCL 4 MG/2ML IJ SOLN
4.0000 mg | Freq: Once | INTRAMUSCULAR | Status: AC
  Administered 2011-10-01: 4 mg via INTRAVENOUS
  Filled 2011-10-01: qty 2

## 2011-10-01 MED ORDER — HYDROMORPHONE HCL PF 1 MG/ML IJ SOLN
1.0000 mg | Freq: Once | INTRAMUSCULAR | Status: AC
  Administered 2011-10-01: 1 mg via INTRAVENOUS
  Filled 2011-10-01: qty 1

## 2011-10-01 MED ORDER — TRAMADOL HCL 50 MG PO TABS: 50.0000 mg | ORAL_TABLET | Freq: Four times a day (QID) | ORAL | Status: AC | PRN

## 2011-10-01 MED ORDER — SODIUM CHLORIDE 0.9 % IV BOLUS (SEPSIS)
1000.0000 mL | Freq: Once | INTRAVENOUS | Status: AC
  Administered 2011-10-01: 1000 mL via INTRAVENOUS

## 2011-10-01 MED ORDER — ONDANSETRON HCL 4 MG PO TABS: 4.0000 mg | ORAL_TABLET | Freq: Four times a day (QID) | ORAL | Status: AC

## 2011-10-01 MED ORDER — OXYCODONE-ACETAMINOPHEN 5-325 MG PO TABS
1.0000 | ORAL_TABLET | Freq: Once | ORAL | Status: AC
  Administered 2011-10-01: 1 via ORAL
  Filled 2011-10-01: qty 1

## 2011-10-01 MED ORDER — KETOROLAC TROMETHAMINE 30 MG/ML IJ SOLN
60.0000 mg | Freq: Once | INTRAMUSCULAR | Status: AC
Start: 2011-10-01 — End: 2011-10-01
  Administered 2011-10-01: 30 mg via INTRAVENOUS
  Filled 2011-10-01: qty 1

## 2011-10-01 NOTE — ED Notes (Signed)
MD at bedside. 

## 2011-10-01 NOTE — ED Notes (Signed)
Returned from CT scan.

## 2011-10-01 NOTE — ED Notes (Signed)
Patient transported to CT 

## 2011-10-01 NOTE — ED Provider Notes (Addendum)
History     CSN: [id number]  Arrival date & time 10/01/11  0124   First MD Initiated Contact with Patient 10/01/11 0355      Chief Complaint  Patient presents with  . Flank Pain  H/o chronic renal calculi.  States L flank pain with radiation to groin since 1030pm.  Feels like "kidney stone."  + N/V. No fevers. No chest pain or dyspnea.   (Consider location/radiation/quality/duration/timing/severity/associated sxs/prior treatment) HPI  Past Medical History  Diagnosis Date  . Renal disorder   . Kidney stones     Past Surgical History  Procedure Date  . L ankle surgery     Plates and screws placed last October at [facility]  . Reconstructive surgery r great toe     Family History  Problem Relation Age of Onset  . Heart failure Mother     History  Substance Use Topics  . Smoking status: Never Smoker   . Smokeless tobacco: Never Used  . Alcohol Use: No      Review of Systems  All other systems reviewed and are negative.    Allergies  Review of patient's allergies indicates no known allergies.  Home Medications   Current Outpatient Rx  Name Route Sig Dispense Refill  . IBUPROFEN 800 MG PO TABS Oral Take 1,600 mg by mouth every 8 (eight) hours as needed. Pain.     Marland Kitchen VITAMIN A 78295 UNITS PO CAPS Oral Take 10,000 Units by mouth daily.      Marland Kitchen VITAMIN E 400 UNITS PO CAPS Oral Take 400 Units by mouth daily.        BP 136/89  Pulse 97  Temp(Src) 98.4 F (36.9 C) (Oral)  Resp 20  SpO2 95%  Physical Exam  Nursing note and vitals reviewed. Constitutional: He is oriented to person, place, and time. He appears well-developed and well-nourished.  HENT:  Head: Normocephalic and atraumatic.  Eyes: Conjunctivae and EOM are normal. Pupils are equal, round, and reactive to light.  Neck: Neck supple.  Cardiovascular: Normal rate and regular rhythm.  Exam reveals no gallop and no friction rub.   No murmur heard. Pulmonary/Chest: Breath sounds normal. He has no  wheezes. He has no rales. He exhibits no tenderness.  Abdominal: Soft. Bowel sounds are normal. He exhibits no distension. There is no tenderness. There is no rebound and no guarding.  Genitourinary:       + L CVA tenderness  Musculoskeletal: Normal range of motion.  Neurological: He is alert and oriented to person, place, and time. No cranial nerve deficit. Coordination normal.  Skin: Skin is warm and dry. No rash noted.  Psychiatric: He has a normal mood and affect.    ED Course  Procedures (including critical care time)  Labs Reviewed  URINALYSIS, DIPSTICK ONLY - Abnormal; Notable for the following:    Bilirubin Urine LARGE (*)    Ketones, ur >80 (*)    Protein, ur 30 (*)    Leukocytes, UA SMALL (*)    All other components within normal limits  CBC  DIFFERENTIAL  COMPREHENSIVE METABOLIC PANEL  LIPASE, BLOOD   No results found.   No diagnosis found.    MDM  Pt is seen and examined;  Initial history and physical completed.  Will follow.          Gradie Ohm A. Patrica Duel, MD 10/01/11 0410  Results for orders placed during the hospital encounter of 10/01/11  URINALYSIS, DIPSTICK ONLY      Component Value  Range   Specific Gravity, Urine 1.028  1.005 - 1.030    pH 5.5  5.0 - 8.0    Glucose, UA NEGATIVE  NEGATIVE (mg/dL)   Hgb urine dipstick NEGATIVE  NEGATIVE    Bilirubin Urine LARGE (*) NEGATIVE    Ketones, ur >80 (*) NEGATIVE (mg/dL)   Protein, ur 30 (*) NEGATIVE (mg/dL)   Urobilinogen, UA 1.0  0.0 - 1.0 (mg/dL)   Nitrite NEGATIVE  NEGATIVE    Leukocytes, UA SMALL (*) NEGATIVE   CBC      Component Value Range   WBC 9.9  4.0 - 10.5 (K/uL)   RBC 5.39  4.22 - 5.81 (MIL/uL)   Hemoglobin 16.2  13.0 - 17.0 (g/dL)   HCT 08.6  57.8 - 46.9 (%)   MCV 85.9  78.0 - 100.0 (fL)   MCH 30.1  26.0 - 34.0 (pg)   MCHC 35.0  30.0 - 36.0 (g/dL)   RDW 62.9  52.8 - 41.3 (%)   Platelets 286  150 - 400 (K/uL)  DIFFERENTIAL      Component Value Range   Neutrophils Relative 73  43 -  77 (%)   Neutro Abs 7.2  1.7 - 7.7 (K/uL)   Lymphocytes Relative 18  12 - 46 (%)   Lymphs Abs 1.7  0.7 - 4.0 (K/uL)   Monocytes Relative 8  3 - 12 (%)   Monocytes Absolute 0.8  0.1 - 1.0 (K/uL)   Eosinophils Relative 1  0 - 5 (%)   Eosinophils Absolute 0.1  0.0 - 0.7 (K/uL)   Basophils Relative 0  0 - 1 (%)   Basophils Absolute 0.0  0.0 - 0.1 (K/uL)  COMPREHENSIVE METABOLIC PANEL      Component Value Range   Sodium 138  135 - 145 (mEq/L)   Potassium 5.1  3.5 - 5.1 (mEq/L)   Chloride 100  96 - 112 (mEq/L)   CO2 24  19 - 32 (mEq/L)   Glucose, Bld 102 (*) 70 - 99 (mg/dL)   BUN 14  6 - 23 (mg/dL)   Creatinine, Ser 2.44  0.50 - 1.35 (mg/dL)   Calcium 01.0  8.4 - 10.5 (mg/dL)   Total Protein 8.2  6.0 - 8.3 (g/dL)   Albumin 4.5  3.5 - 5.2 (g/dL)   AST 63 (*) 0 - 37 (U/L)   ALT 57 (*) 0 - 53 (U/L)   Alkaline Phosphatase 90  39 - 117 (U/L)   Total Bilirubin 0.8  0.3 - 1.2 (mg/dL)   GFR calc non Af Amer >90  >90 (mL/min)   GFR calc Af Amer >90  >90 (mL/min)  LIPASE, BLOOD      Component Value Range   Lipase 41  11 - 59 (U/L)   Ct Abdomen Pelvis Wo Contrast  10/01/2011  *RADIOLOGY REPORT*  Clinical Data: Left flank pain and vomiting.  Bilirubin, ketones and protein within the urine.  CT ABDOMEN AND PELVIS WITHOUT CONTRAST  Technique:  Multidetector CT imaging of the abdomen and pelvis was performed following the standard protocol without intravenous contrast.  Comparison: CT of the abdomen and pelvis performed 09/10/2011  Findings: The visualized lung bases are clear.  A 5.3 cm hypodensity at the posterior right hepatic lobe most likely reflects a cyst, given its attenuation.  The liver is otherwise unremarkable in appearance.  The spleen is within normal limits.  The gallbladder is unremarkable.  The pancreas and adrenal glands are within normal limits.  Scattered nonobstructing stones are  noted within both kidneys, more prominent on the right, measuring up to 6 mm in size.  There is no  evidence of hydronephrosis; no obstructing ureteral stones are seen.  No perinephric stranding is appreciated.  No free fluid is identified.  The small bowel is unremarkable in appearance.  The stomach is within normal limits.  No acute vascular abnormalities are seen.  Minimal calcification is noted along the distal abdominal aorta and its branches.  The appendix is normal in caliber, without evidence for appendicitis.  Scattered diverticulosis is noted along the sigmoid colon, without evidence of diverticulitis.  The bladder is mildly distended and grossly unremarkable in appearance.  The prostate remains normal in size.  No inguinal lymphadenopathy is seen.  No acute osseous abnormalities are identified.    IMPRESSION:  1.  No evidence of hydronephrosis; no obstructing ureteral stones seen. 2.  Scattered nonobstructing bilateral renal stones, more prominent on the right, measuring up to 6 mm in size. 3.  Scattered diverticulosis along the sigmoid colon, without evidence of diverticulitis. 4.  Likely large hepatic cyst noted.  Original Report Authenticated By: Tonia Ghent, M.D.   Ct Abdomen Pelvis Wo Contrast  09/10/2011  *RADIOLOGY REPORT*  Clinical Data: Right-sided flank pain, history kidney stones  CT ABDOMEN AND PELVIS WITHOUT CONTRAST  Technique:  Multidetector CT imaging of the abdomen and pelvis was performed following the standard protocol without intravenous contrast.  Comparison: CT abdomen pelvis of 07/14/2011  Findings: The lung bases are clear.  The liver is stable with a rounded low attenuation lesion remains stable in the posterior right lobe.  No ductal dilatation is seen.  No calcified gallstones are noted.  The pancreas is normal in size and the pancreatic duct is not dilated.  The adrenal glands and spleen are unremarkable. The stomach is not well distended.  There is no change in a right upper pole calculus which is nonobstructing with a lower pole calculus also on the right.  No  hydronephrosis is seen, with a tiny left renal calculus in the lower pole.  The abdominal aorta is normal in caliber.  No adenopathy is seen.  The ureters are normal in caliber and no distal ureteral calculus is seen.  The urinary bladder is unremarkable.  The prostate is normal in size.  No fluid is seen within the pelvis.  No abnormality of the colon is seen.  The appendix and terminal ileum are unremarkable.  No bony abnormality is seen.  IMPRESSION:  1.  No change in bilateral renal calculi.  No hydronephrosis. 2.  No distal ureteral calculi are noted. 3.  The appendix and terminal ileum appear normal. 4.  Stable rounded low attenuation lesion in the posterior right lobe of liver.  Original Report Authenticated By: Juline Patch, M.D.   Dg Ankle Complete Left  09/07/2011  *RADIOLOGY REPORT*  Clinical Data: Twisting injury, pain.  Remote injury.  LEFT ANKLE COMPLETE - 3+ VIEW  Comparison: 05/29/2011  Findings: Hardware within the distal fibula and medial malleolus/distal tibia are unchanged.  No hardware complicating feature.  No acute fracture, subluxation or dislocation.  Soft tissues are intact.  IMPRESSION: No acute findings.  Stable post-traumatic/postoperative changes.  Original Report Authenticated By: Cyndie Chime, M.D.   Dg Abd 1 View  09/10/2011  *RADIOLOGY REPORT*  Clinical Data: Right flank pain for 4 hours.  ABDOMEN - 1 VIEW  Comparison: Abdominal radiograph performed 09/25/2006, and CT of the abdomen and pelvis performed 07/14/2011  Findings: There is question  of a 6 mm stone overlying the right renal pelvis.  A 6 mm stone is noted at the upper pole of the right kidney.  No additional stones are characterized along the course of the right ureter.  The visualized bowel gas pattern is grossly unremarkable.  No free intra-abdominal air is seen, though evaluation for free air is suboptimal on a supine views.  No acute osseous abnormalities are identified.  IMPRESSION: Question of 6 mm stone  overlying the right renal pelvis, which appears to have migrated medially since the prior study.  6 mm stone also noted at the upper pole of the right kidney.  Original Report Authenticated By: Tonia Ghent, M.D.      Woodward Klem A. Patrica Duel, MD 10/01/11 [phone]

## 2011-10-01 NOTE — ED Notes (Signed)
Pt complains of left flank pain since 1030pm, hx of kidney stones and the pain is the same

## 2011-10-25 ENCOUNTER — Encounter (HOSPITAL_COMMUNITY): Payer: Self-pay | Admitting: Emergency Medicine

## 2011-10-25 ENCOUNTER — Emergency Department (HOSPITAL_COMMUNITY): Payer: Self-pay

## 2011-10-25 ENCOUNTER — Emergency Department (HOSPITAL_COMMUNITY)
Admission: EM | Admit: 2011-10-25 | Discharge: 2011-10-25 | Disposition: A | Discharge status: Home / Self Care | Payer: Self-pay | Attending: Emergency Medicine | Admitting: Emergency Medicine

## 2011-10-25 DIAGNOSIS — Z9889 Other specified postprocedural states: Secondary | ICD-10-CM | POA: Insufficient documentation

## 2011-10-25 DIAGNOSIS — S93402A Sprain of unspecified ligament of left ankle, initial encounter: Secondary | ICD-10-CM

## 2011-10-25 DIAGNOSIS — S93409A Sprain of unspecified ligament of unspecified ankle, initial encounter: Secondary | ICD-10-CM | POA: Insufficient documentation

## 2011-10-25 DIAGNOSIS — M25579 Pain in unspecified ankle and joints of unspecified foot: Secondary | ICD-10-CM | POA: Insufficient documentation

## 2011-10-25 DIAGNOSIS — X500XXA Overexertion from strenuous movement or load, initial encounter: Secondary | ICD-10-CM | POA: Insufficient documentation

## 2011-10-25 DIAGNOSIS — Z87442 Personal history of urinary calculi: Secondary | ICD-10-CM | POA: Insufficient documentation

## 2011-10-25 DIAGNOSIS — M25476 Effusion, unspecified foot: Secondary | ICD-10-CM | POA: Insufficient documentation

## 2011-10-25 DIAGNOSIS — M25473 Effusion, unspecified ankle: Secondary | ICD-10-CM | POA: Insufficient documentation

## 2011-10-25 MED ORDER — HYDROMORPHONE HCL PF 2 MG/ML IJ SOLN
1.0000 mg | Freq: Once | INTRAMUSCULAR | Status: AC
  Administered 2011-10-25: 1 mg via INTRAMUSCULAR
  Filled 2011-10-25: qty 1

## 2011-10-25 MED ORDER — HYDROCODONE-ACETAMINOPHEN 5-325 MG PO TABS: 1.0000 | ORAL_TABLET | Freq: Four times a day (QID) | ORAL | Status: AC | PRN

## 2011-10-25 MED ORDER — HYDROMORPHONE HCL PF 2 MG/ML IJ SOLN
1.0000 mg | Freq: Once | INTRAMUSCULAR | Status: AC
  Administered 2011-10-25: 1 mg via INTRAMUSCULAR
  Filled 2011-10-25 (×2): qty 1

## 2011-10-25 NOTE — ED Provider Notes (Signed)
History     CSN: [id number]  Arrival date & time 10/25/11  1620   First MD Initiated Contact with Patient 10/25/11 1716     5:43 PM HPI Patient reports she was climbing down a ladder when he twisted his left ankle. Reports severe pain in left ankle bilaterally. Reports unable to bear weight due to severe pain. Patient reports a history of significant fracture of left ankle last year. Reports taking 2 800 mg ibuprofen without relief. Denies numbness, tingling, bruising. Reports appears to be little bit swollen. Patient is a 56 y.o. male presenting with ankle pain. The history is provided by the patient.  Ankle Pain  The incident occurred at work. The injury mechanism was torsion. The pain is present in the left ankle. The pain is severe. The pain has been constant since onset. Associated symptoms include inability to bear weight. Pertinent negatives include no numbness, no muscle weakness, no loss of sensation and no tingling. The symptoms are aggravated by activity, bearing weight and palpation. He has tried NSAIDs for the symptoms. The treatment provided no relief.    Past Medical History  Diagnosis Date  . Renal disorder   . Kidney stones     Past Surgical History  Procedure Date  . L ankle surgery     Plates and screws placed last October at [facility]  . Reconstructive surgery r great toe     Family History  Problem Relation Age of Onset  . Heart failure Mother     History  Substance Use Topics  . Smoking status: Never Smoker   . Smokeless tobacco: Never Used  . Alcohol Use: No      Review of Systems  Musculoskeletal: Positive for joint swelling. Negative for back pain.       Ankle pain  Skin: Negative for color change and wound.  Neurological: Negative for tingling, weakness and numbness.  All other systems reviewed and are negative.    Allergies  Review of patient's allergies indicates no known allergies.  Home Medications   Current Outpatient Rx  Name  Route Sig Dispense Refill  . IBUPROFEN 800 MG PO TABS Oral Take 1,600 mg by mouth every 8 (eight) hours as needed. Pain.     Marland Kitchen VITAMIN A 40981 UNITS PO CAPS Oral Take 10,000 Units by mouth daily.      Marland Kitchen VITAMIN E 400 UNITS PO CAPS Oral Take 400 Units by mouth daily.        BP 151/66  Pulse 74  Temp(Src) 98.1 F (36.7 C) (Oral)  Resp 16  SpO2 96%  Physical Exam  Constitutional: He is oriented to person, place, and time. He appears well-developed and well-nourished.  HENT:  Head: Normocephalic and atraumatic.  Eyes: Conjunctivae are normal. Pupils are equal, round, and reactive to light.  Neck: Normal range of motion. Neck supple.  Cardiovascular: Normal rate, regular rhythm and normal heart sounds.   Pulmonary/Chest: Effort normal and breath sounds normal.  Abdominal: Soft. Bowel sounds are normal.  Musculoskeletal:       Left ankle: He exhibits decreased range of motion and swelling. He exhibits no laceration and normal pulse. tenderness. Lateral malleolus and medial malleolus tenderness found. No AITFL, no posterior TFL, no head of 5th metatarsal and no proximal fibula tenderness found. Achilles tendon normal.       Capillary refill normal. Strength decreased. Tenderness with palpation of posterior lateral and medial malleolus. Sensation normal.  Neurological: He is alert and oriented to person, place,  and time.  Skin: Skin is warm and dry. No rash noted. No erythema. No pallor.  Psychiatric: He has a normal mood and affect. His behavior is normal.    ED Course  Procedures   Dg Ankle Complete Left  10/25/2011  *RADIOLOGY REPORT*  Clinical Data: Injured left ankle.  History of previous surgery.  LEFT ANKLE COMPLETE - 3+ VIEW  Comparison: 09/07/2011.  Findings: Stable position and appearance of the fixating hardware. No complicating features.  The ankle mortise is maintained.  No acute ankle fracture.  There are early tibiotalar joint degenerative changes.  IMPRESSION:  1.  Remote  post-traumatic changes with fixating internal hardware. No complicating features. 2.  No acute fracture.  Original Report Authenticated By: P. Loralie Champagne, M.D.     MDM    Reports improvement after medication. X-ray is negative for acute fracture. Will place ASO and crutches. Advised followup with orthopedic physician in [place]. Patient agrees with this ready for discharge.     Thomasene Lot, PA-C 10/25/11 1929

## 2011-10-25 NOTE — ED Provider Notes (Signed)
Medical screening examination/treatment/procedure(s) were performed by non-physician practitioner and as supervising physician I was immediately available for consultation/collaboration.   Linsay Vogt, MD 10/25/11 2002 

## 2011-10-25 NOTE — ED Notes (Signed)
Patient transported to X-ray 

## 2011-10-25 NOTE — ED Notes (Signed)
Pt c/o left ankle pain after twisting it when coming off ladder; pt sts hx of sx on that ankle

## 2011-10-25 NOTE — Progress Notes (Signed)
Orthopedic Tech Progress Note Patient Details:  Andranik Jeune [facility] [birth year] [id number]  Other Ortho Devices Type of Ortho Device: Crutches;ASO Ortho Device Location: left ankle Ortho Device Interventions: Application   Alvis Pulcini 10/25/2011, 8:04 PM

## 2011-10-25 NOTE — ED Notes (Addendum)
Ortho at bedside for application of ASO splint and crutch training.

## 2011-10-27 ENCOUNTER — Emergency Department: Payer: Self-pay | Admitting: Internal Medicine

## 2011-10-27 LAB — URINALYSIS, COMPLETE
Bacteria: NONE SEEN
Bilirubin,UR: NEGATIVE
Ketone: NEGATIVE
Ph: 5 (ref 4.5–8.0)
RBC,UR: 1 /HPF (ref 0–5)
Specific Gravity: 1.021 (ref 1.003–1.030)
Squamous Epithelial: NONE SEEN
WBC UR: 2 /HPF (ref 0–5)

## 2011-10-27 LAB — CBC
HCT: 43.9 % (ref 40.0–52.0)
HGB: 14.8 g/dL (ref 13.0–18.0)
MCH: 30.5 pg (ref 26.0–34.0)
MCHC: 33.6 g/dL (ref 32.0–36.0)
Platelet: 237 10*3/uL (ref 150–440)
RDW: 14.6 % — ABNORMAL HIGH (ref 11.5–14.5)

## 2011-10-27 LAB — BASIC METABOLIC PANEL
Creatinine: 0.67 mg/dL (ref 0.60–1.30)
EGFR (African American): >60
EGFR (Non-African Amer.): >60
Glucose: 112 mg/dL — ABNORMAL HIGH (ref 65–99)
Potassium: 4.1 mmol/L (ref 3.5–5.1)
Sodium: 141 mmol/L (ref 136–145)

## 2011-10-28 LAB — URINE CULTURE

## 2011-11-05 ENCOUNTER — Encounter (HOSPITAL_COMMUNITY): Payer: Self-pay | Admitting: Emergency Medicine

## 2011-11-05 ENCOUNTER — Emergency Department (HOSPITAL_COMMUNITY)
Admission: EM | Admit: 2011-11-05 | Discharge: 2011-11-06 | Disposition: A | Discharge status: Other Acute Inpatient Hospital | Payer: Self-pay | Attending: Emergency Medicine | Admitting: Emergency Medicine

## 2011-11-05 DIAGNOSIS — F1110 Opioid abuse, uncomplicated: Secondary | ICD-10-CM | POA: Insufficient documentation

## 2011-11-05 DIAGNOSIS — N289 Disorder of kidney and ureter, unspecified: Secondary | ICD-10-CM | POA: Insufficient documentation

## 2011-11-05 DIAGNOSIS — Z87442 Personal history of urinary calculi: Secondary | ICD-10-CM | POA: Insufficient documentation

## 2011-11-05 DIAGNOSIS — F1910 Other psychoactive substance abuse, uncomplicated: Secondary | ICD-10-CM

## 2011-11-05 DIAGNOSIS — Z008 Encounter for other general examination: Secondary | ICD-10-CM | POA: Insufficient documentation

## 2011-11-05 DIAGNOSIS — F1010 Alcohol abuse, uncomplicated: Secondary | ICD-10-CM | POA: Insufficient documentation

## 2011-11-05 LAB — URINALYSIS, ROUTINE W REFLEX MICROSCOPIC
Glucose, UA: NEGATIVE mg/dL
Hgb urine dipstick: NEGATIVE
Ketones, ur: NEGATIVE mg/dL
Leukocytes, UA: NEGATIVE
Protein, ur: NEGATIVE mg/dL
Urobilinogen, UA: 1 mg/dL (ref 0.0–1.0)

## 2011-11-05 LAB — CBC
HCT: 42.4 % (ref 39.0–52.0)
MCHC: 34 g/dL (ref 30.0–36.0)
Platelets: 269 10*3/uL (ref 150–400)
RDW: 14.1 % (ref 11.5–15.5)
WBC: 9.1 10*3/uL (ref 4.0–10.5)

## 2011-11-05 LAB — COMPREHENSIVE METABOLIC PANEL
AST: 41 U/L — ABNORMAL HIGH (ref 0–37)
Albumin: 4 g/dL (ref 3.5–5.2)
Alkaline Phosphatase: 100 U/L (ref 39–117)
Chloride: 101 mEq/L (ref 96–112)
Potassium: 3.8 mEq/L (ref 3.5–5.1)
Sodium: 137 mEq/L (ref 135–145)
Total Bilirubin: 0.3 mg/dL (ref 0.3–1.2)
Total Protein: 7.6 g/dL (ref 6.0–8.3)

## 2011-11-05 LAB — RAPID URINE DRUG SCREEN, HOSP PERFORMED
Amphetamines: NOT DETECTED
Benzodiazepines: NOT DETECTED
Tetrahydrocannabinol: NOT DETECTED

## 2011-11-05 LAB — ETHANOL: Alcohol, Ethyl (B): <11 mg/dL (ref 0–11)

## 2011-11-05 NOTE — ED Notes (Signed)
Pt states he took a vicodin about 2 hours ago

## 2011-11-05 NOTE — ED Notes (Signed)
Pt alert, nad, presents to Ed, requesting detox from drugs and alcohol, last drink earlier today, last used a few hours ago, denies SI/HI, resp even unlabored, skin pwd

## 2011-11-05 NOTE — ED Notes (Signed)
Security bedside to search belongings, wand pt 

## 2011-11-06 ENCOUNTER — Encounter (HOSPITAL_COMMUNITY): Payer: Self-pay | Admitting: *Deleted

## 2011-11-06 ENCOUNTER — Inpatient Hospital Stay (HOSPITAL_COMMUNITY)
Admission: AD | Admit: 2011-11-06 | Discharge: 2011-11-12 | DRG: 897 | Disposition: A | Discharge status: Home / Self Care | Payer: PRIVATE HEALTH INSURANCE | Source: Ambulatory Visit | Attending: Psychiatry | Admitting: Psychiatry

## 2011-11-06 DIAGNOSIS — F329 Major depressive disorder, single episode, unspecified: Secondary | ICD-10-CM

## 2011-11-06 DIAGNOSIS — Z5900 Homelessness unspecified: Secondary | ICD-10-CM

## 2011-11-06 DIAGNOSIS — Z79899 Other long term (current) drug therapy: Secondary | ICD-10-CM

## 2011-11-06 DIAGNOSIS — F1120 Opioid dependence, uncomplicated: Principal | ICD-10-CM | POA: Diagnosis present

## 2011-11-06 DIAGNOSIS — F411 Generalized anxiety disorder: Secondary | ICD-10-CM

## 2011-11-06 DIAGNOSIS — F19939 Other psychoactive substance use, unspecified with withdrawal, unspecified: Secondary | ICD-10-CM

## 2011-11-06 DIAGNOSIS — F1193 Opioid use, unspecified with withdrawal: Secondary | ICD-10-CM | POA: Diagnosis not present

## 2011-11-06 DIAGNOSIS — F1123 Opioid dependence with withdrawal: Secondary | ICD-10-CM | POA: Diagnosis not present

## 2011-11-06 DIAGNOSIS — Z87442 Personal history of urinary calculi: Secondary | ICD-10-CM

## 2011-11-06 DIAGNOSIS — F1020 Alcohol dependence, uncomplicated: Secondary | ICD-10-CM | POA: Diagnosis present

## 2011-11-06 DIAGNOSIS — IMO0002 Reserved for concepts with insufficient information to code with codable children: Secondary | ICD-10-CM

## 2011-11-06 DIAGNOSIS — F3289 Other specified depressive episodes: Secondary | ICD-10-CM

## 2011-11-06 HISTORY — DX: Anxiety disorder, unspecified: F41.9

## 2011-11-06 HISTORY — DX: Opioid dependence with withdrawal: F11.23

## 2011-11-06 HISTORY — DX: Depression, unspecified: F32.A

## 2011-11-06 HISTORY — DX: Major depressive disorder, single episode, unspecified: F32.9

## 2011-11-06 MED ORDER — ONDANSETRON HCL 4 MG PO TABS
4.0000 mg | ORAL_TABLET | Freq: Three times a day (TID) | ORAL | Status: DC | PRN
  Administered 2011-11-06: 4 mg via ORAL
  Filled 2011-11-06: qty 1

## 2011-11-06 MED ORDER — LOPERAMIDE HCL 2 MG PO CAPS
2.0000 mg | ORAL_CAPSULE | ORAL | Status: AC | PRN
  Administered 2011-11-10: 4 mg via ORAL
  Administered 2011-11-11 (×2): 2 mg via ORAL

## 2011-11-06 MED ORDER — CLONIDINE HCL 0.1 MG PO TABS
0.1000 mg | ORAL_TABLET | Freq: Four times a day (QID) | ORAL | Status: AC
  Administered 2011-11-07 – 2011-11-09 (×10): 0.1 mg via ORAL
  Filled 2011-11-06 (×10): qty 1

## 2011-11-06 MED ORDER — IBUPROFEN 600 MG PO TABS: 600.0000 mg | ORAL_TABLET | Freq: Three times a day (TID) | ORAL | Status: DC | PRN

## 2011-11-06 MED ORDER — NAPROXEN 500 MG PO TABS
500.0000 mg | ORAL_TABLET | Freq: Two times a day (BID) | ORAL | Status: AC | PRN
  Administered 2011-11-11: 500 mg via ORAL
  Filled 2011-11-06: qty 1

## 2011-11-06 MED ORDER — ONDANSETRON 4 MG PO TBDP
4.0000 mg | ORAL_TABLET | Freq: Four times a day (QID) | ORAL | Status: AC | PRN
  Administered 2011-11-07 – 2011-11-08 (×5): 4 mg via ORAL
  Filled 2011-11-06 (×3): qty 1

## 2011-11-06 MED ORDER — CLONIDINE HCL 0.1 MG PO TABS
0.1000 mg | ORAL_TABLET | Freq: Every day | ORAL | Status: DC
  Administered 2011-11-12: 0.1 mg via ORAL
  Filled 2011-11-06 (×2): qty 1

## 2011-11-06 MED ORDER — ALUM & MAG HYDROXIDE-SIMETH 200-200-20 MG/5ML PO SUSP
30.0000 mL | ORAL | Status: DC | PRN
Start: 2011-11-06 — End: 2011-11-12

## 2011-11-06 MED ORDER — DIPHENHYDRAMINE HCL 50 MG PO CAPS
50.0000 mg | ORAL_CAPSULE | Freq: Every evening | ORAL | Status: DC | PRN
  Filled 2011-11-06 (×4): qty 1

## 2011-11-06 MED ORDER — ACETAMINOPHEN 325 MG PO TABS: 650.0000 mg | ORAL_TABLET | ORAL | Status: DC | PRN

## 2011-11-06 MED ORDER — DICYCLOMINE HCL 20 MG PO TABS
20.0000 mg | ORAL_TABLET | ORAL | Status: AC | PRN
  Administered 2011-11-09 – 2011-11-10 (×4): 20 mg via ORAL
  Filled 2011-11-06 (×4): qty 1

## 2011-11-06 MED ORDER — METHOCARBAMOL 500 MG PO TABS
500.0000 mg | ORAL_TABLET | Freq: Three times a day (TID) | ORAL | Status: AC | PRN
  Administered 2011-11-07 – 2011-11-11 (×6): 500 mg via ORAL
  Filled 2011-11-06 (×6): qty 1

## 2011-11-06 MED ORDER — HYDROXYZINE HCL 25 MG PO TABS
25.0000 mg | ORAL_TABLET | Freq: Four times a day (QID) | ORAL | Status: AC | PRN
  Administered 2011-11-07 – 2011-11-11 (×12): 25 mg via ORAL
  Filled 2011-11-06 (×2): qty 1

## 2011-11-06 MED ORDER — MAGNESIUM HYDROXIDE 400 MG/5ML PO SUSP: 30.0000 mL | Freq: Every day | ORAL | Status: DC | PRN

## 2011-11-06 MED ORDER — CLONIDINE HCL 0.1 MG PO TABS
0.1000 mg | ORAL_TABLET | ORAL | Status: AC
  Administered 2011-11-09 – 2011-11-11 (×4): 0.1 mg via ORAL
  Filled 2011-11-06 (×4): qty 1

## 2011-11-06 MED ORDER — LORAZEPAM 1 MG PO TABS: 1.0000 mg | ORAL_TABLET | Freq: Three times a day (TID) | ORAL | Status: DC | PRN

## 2011-11-06 MED ORDER — NICOTINE 21 MG/24HR TD PT24
21.0000 mg | MEDICATED_PATCH | Freq: Every day | TRANSDERMAL | Status: DC
  Administered 2011-11-07 – 2011-11-10 (×3): 21 mg via TRANSDERMAL
  Filled 2011-11-06 (×6): qty 1

## 2011-11-06 MED ORDER — ACETAMINOPHEN 325 MG PO TABS: 650.0000 mg | ORAL_TABLET | Freq: Four times a day (QID) | ORAL | Status: DC | PRN

## 2011-11-06 MED ORDER — ALUM & MAG HYDROXIDE-SIMETH 200-200-20 MG/5ML PO SUSP: 30.0000 mL | ORAL | Status: DC | PRN

## 2011-11-06 NOTE — Progress Notes (Signed)
[facility]  Co-facilitated BH group w/ Forensic scientist. Group focused on withdrawal and coping skills, plan for moving forward after discharge.  Pt was only member present in current group, was active and engaged w/ counselor and chaplain, stated he got tired of using drugs for yrs, decided to get help and wants to enter rehab program. Pt recognized his strengths in asking for help and deciding to make a big change in his life. Pt inquired about services available after leaving psych ed that would facilitated his sobriety, stated that he truly wanted to get help for sobriety. Pt stated he would communicate his desire for additional help to MD and social work Haematologist. Pt reported feeling nauseated in group, requested to return to his room.  Darren Mendoza B MS, LPCA, NCC

## 2011-11-06 NOTE — ED Provider Notes (Signed)
History     CSN: [id number]  Arrival date & time 11/05/11  2221   First MD Initiated Contact with Patient 11/05/11 2316      Chief Complaint  Patient presents with  . Medical Clearance  . Drug / Alcohol Assessment    Patient is a 56 y.o. male presenting with drug/alcohol assessment and mental health disorder.  Drug / Alcohol Assessment  Mental Health Problem   patient presents requesting detox from alcohol and opiates. States he's used both daily for approximately 2 years. Denies cocaine or other illicit drug use. Last used today patient states she took 10 Vicodin 5 mg at 9 clock he had approximately 10 Percocet 6 AM yesterday this morning at approximately 2/22 ounce beers just prior to arrival. States that "he's sick and tired of it". 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 Denies SI or HI. Patient is drinking alcohol but states opiates are his significant problem. Denies having gone through rehabilitation prior to this time. Patient is alert oriented and cooperative. Other than his drug and alcohol abuse patient denies any physical complaints or recent illnesses.   Past Medical History  Diagnosis Date  . Renal disorder   . Kidney stones     Past Surgical History  Procedure Date  . L ankle surgery     Plates and screws placed last October at [facility]  . Reconstructive surgery r great toe     Family History  Problem Relation Age of Onset  . Heart failure Mother     History  Substance Use Topics  . Smoking status: Never Smoker   . Smokeless tobacco: Never Used  . Alcohol Use: Yes      Review of Systems  Constitutional: Negative.   HENT: Negative.   Eyes: Negative.   Respiratory: Negative.   Cardiovascular: Negative.   Gastrointestinal: Negative.   Genitourinary: Negative.   Musculoskeletal: Negative.   Skin: Negative.   Neurological: Negative.   Hematological: Negative.   Psychiatric/Behavioral: Negative.     Allergies  Review of patient's  allergies indicates no known allergies.  Home Medications  No current outpatient prescriptions on file.  BP 156/89  Pulse 91  Temp(Src) 97.7 F (36.5 C) (Oral)  Resp 18  SpO2 100%  Physical Exam  Constitutional: He is oriented to person, place, and time. He appears well-developed and well-nourished.  HENT:  Head: Normocephalic and atraumatic.  Eyes: Conjunctivae are normal.  Neck: Neck supple.  Cardiovascular: Normal rate and regular rhythm.   Pulmonary/Chest: Effort normal and breath sounds normal.  Abdominal: Soft. Bowel sounds are normal.  Musculoskeletal: Normal range of motion.  Neurological: He is alert and oriented to person, place, and time.  Skin: Skin is warm and dry.  Psychiatric: He has a normal mood and affect.    ED Course  Procedures  Will move patient to psych ED holding to complete medical clearance and then discuss with Act Team for possible inpatient placement.  0200: Patient is medically cleared. I have spoken with Aurther Loft with the back pain has agreed to evaluate patient for possible inpatient placement for detox.  0530: Discussed patient with Dr. Hyacinth Meeker who is aware of plan.  Labs Reviewed  COMPREHENSIVE METABOLIC PANEL - Abnormal; Notable for the following:    Glucose, Bld 125 (*)    AST 41 (*)    All other components within normal limits  URINE RAPID DRUG SCREEN (HOSP PERFORMED) - Abnormal; Notable for the following:    Opiates POSITIVE (*)  All other components within normal limits  URINALYSIS, ROUTINE W REFLEX MICROSCOPIC - Abnormal; Notable for the following:    APPearance CLOUDY (*)    Bilirubin Urine SMALL (*)    All other components within normal limits  CBC  ETHANOL   No results found.   No diagnosis found.    MDM  HPI/PE and clinical findings c/w 1. Opiate abuse 2. ETOH abuse  (Evaluation by Act Team pending)        Leanne Chang, NP 11/06/11 0402  Roma Kayser Breandan People, NP 11/06/11 [phone]

## 2011-11-06 NOTE — BH Assessment (Signed)
Assessment Note   Darren Mendoza is a 56 y.o. male who presents to [facility] with request for detox from alcohol and opiates(percocet,vicodin, morphine).  Pt denies SI/HI/Psych.  Pt consumes 2-22oz beers daily and 6-10mg  percocet, 10-5mg  vicodin and 2-30mg  morphine tabs daily, last intake was today.  Pt denies any prior substance abuse tx, is not exhibiting any w/d sxs at this time. Pt says he has been drinking heavily since he was 56 yrs old.     Axis I: Substance Abuse Axis II: Deferred Axis III:  Past Medical History  Diagnosis Date  . Renal disorder   . Kidney stones    Axis IV: other psychosocial or environmental problems, problems related to social environment and problems with primary support group Axis V: 51-60 moderate symptoms  Past Medical History:  Past Medical History  Diagnosis Date  . Renal disorder   . Kidney stones     Past Surgical History  Procedure Date  . L ankle surgery     Plates and screws placed last October at [facility]  . Reconstructive surgery r great toe     Family History:  Family History  Problem Relation Age of Onset  . Heart failure Mother     Social History:  reports that he has never smoked. He has never used smokeless tobacco. He reports that he drinks alcohol. He reports that he uses illicit drugs (Morphine).  Additional Social History:  Alcohol / Drug Use Pain Medications: Percocet, Vicodin, Morphine  Prescriptions: None  Over the Counter: None  History of alcohol / drug use?: Yes Longest period of sobriety (when/how long): None  Negative Consequences of Use: Personal relationships Withdrawal Symptoms: Other (Comment) (None Reported at this time ) Substance #1 Name of Substance 1: Alcohol--Beer 1 - Age of First Use: 8 YOM  1 - Amount (size/oz): 2-22 OZ 1 - Frequency: Daily  1 - Duration: On-going  1 - Last Use / Amount: 11/05/11 Substance #2 Name of Substance 2: Percocet 2 - Age of First Use: 68 YOM  2 - Amount (size/oz):  6-10mg   2 - Frequency: Daily  2 - Duration: On-going  2 - Last Use / Amount: 11/05/11 Substance #3 Name of Substance 3: Vicodin  3 - Age of First Use: 51 YOM  3 - Amount (size/oz): 10-5mg   3 - Frequency: Daily  3 - Duration: On-going  3 - Last Use / Amount: 11/05/11 Substance #4 Name of Substance 4: Morphine  4 - Age of First Use: 51 YOM  4 - Amount (size/oz): 2-30mg   4 - Frequency: Daily  4 - Duration: On-going  4 - Last Use / Amount: 11/05/11 Allergies: No Known Allergies  Home Medications:  No current facility-administered medications on file as of 11/05/2011.   Medications Prior to Admission  Medication Sig Dispense Refill  . HYDROcodone-acetaminophen (NORCO) 5-325 MG per tablet Take 1 tablet by mouth every 6 (six) hours as needed for pain.  15 tablet  0    OB/GYN Status:  No LMP for male patient.  General Assessment Data Location of Assessment: [facility] ED Living Arrangements: Alone Can pt return to current living arrangement?: Yes Admission Status: Voluntary Is patient capable of signing voluntary admission?: Yes Transfer from: Acute Hospital Referral Source: MD  Education Status Is patient currently in school?: No Current Grade: None  Highest grade of school patient has completed: Unk  Name of school: [employer]  Contact person: None   Risk to self Suicidal Ideation: No Suicidal Intent: No Is  patient at risk for suicide?: No Suicidal Plan?: No Access to Means: No Specify Access to Suicidal Means: None  What has been your use of drugs/alcohol within the last 12 months?: Currently abusing Alcohol and Opiates(Pain Pills)  Previous Attempts/Gestures: No How many times?: 0  Other Self Harm Risks: None  Triggers for Past Attempts: None known Intentional Self Injurious Behavior: None Family Suicide History: No Recent stressful life event(s): Other (Comment) (None reported ) Persecutory voices/beliefs?: No Depression: Yes Depression Symptoms: Loss of interest in usual  pleasures Substance abuse history and/or treatment for substance abuse?: No Suicide prevention information given to non-admitted patients: Not applicable  Risk to Others Homicidal Ideation: No Thoughts of Harm to Others: No Current Homicidal Intent: No Current Homicidal Plan: No Access to Homicidal Means: No Identified Victim: None  History of harm to others?: No Assessment of Violence: None Noted Violent Behavior Description: None  Does patient have access to weapons?: No Criminal Charges Pending?: No Does patient have a court date: No  Psychosis Hallucinations: None noted Delusions: None noted  Mental Status Report Appear/Hygiene: Improved Eye Contact: Good Motor Activity: Unremarkable Speech: Logical/coherent Level of Consciousness: Alert Mood: Depressed Affect: Depressed Anxiety Level: None Thought Processes: Coherent;Relevant Judgement: Unimpaired Orientation: Person;Place;Time;Situation Obsessive Compulsive Thoughts/Behaviors: None  Cognitive Functioning Concentration: Normal Memory: Recent Intact;Remote Intact IQ: Average Insight: Good Impulse Control: Good Appetite: Good Weight Loss: 0  Weight Gain: 0  Sleep: Decreased Total Hours of Sleep: 5  Vegetative Symptoms: None  Prior Inpatient Therapy Prior Inpatient Therapy: No Prior Therapy Dates: None  Prior Therapy Facilty/Provider(s): None  Reason for Treatment: None   Prior Outpatient Therapy Prior Outpatient Therapy: No Prior Therapy Dates: None  Prior Therapy Facilty/Provider(s): None  Reason for Treatment: None   ADL Screening (condition at time of admission) Patient's cognitive ability adequate to safely complete daily activities?: Yes Patient able to express need for assistance with ADLs?: Yes Independently performs ADLs?: Yes Weakness of Legs: None Weakness of Arms/Hands: None       Abuse/Neglect Assessment (Assessment to be complete while patient is alone) Physical Abuse:  Denies Verbal Abuse: Denies Sexual Abuse: Denies Exploitation of patient/patient's resources: Denies Self-Neglect: Denies Values / Beliefs Cultural Requests During Hospitalization: None Spiritual Requests During Hospitalization: None Consults Spiritual Care Consult Needed: No Social Work Consult Needed: No Merchant navy officer (For Healthcare) Advance Directive: Patient does not have advance directive;Patient would not like information Pre-existing out of facility DNR order (yellow form or pink MOST form): No    Additional Information 1:1 In Past 12 Months?: No CIRT Risk: No Elopement Risk: No Does patient have medical clearance?: Yes     Disposition:  Disposition Disposition of Patient: Referred to;Inpatient treatment program Type of inpatient treatment program: Adult Patient referred to: [facility]  On Site Evaluation by:   Reviewed with Physician:     Murrell Redden 11/06/2011 2:09 AM

## 2011-11-06 NOTE — ED Notes (Signed)
Pt was accepted to [facility] by Dr. Suella Broad to Dr. Koren Shiver bed 302-1. EDP notified by RN and is in agreement with disposition. RN to call report. All support paperwork completed and faxed to [facility] for review. No further needs identified at this time.

## 2011-11-07 DIAGNOSIS — Z590 Homelessness: Secondary | ICD-10-CM

## 2011-11-07 DIAGNOSIS — F1020 Alcohol dependence, uncomplicated: Secondary | ICD-10-CM | POA: Diagnosis present

## 2011-11-07 DIAGNOSIS — F1120 Opioid dependence, uncomplicated: Secondary | ICD-10-CM | POA: Diagnosis present

## 2011-11-07 DIAGNOSIS — F1210 Cannabis abuse, uncomplicated: Secondary | ICD-10-CM

## 2011-11-07 MED ORDER — CHLORDIAZEPOXIDE HCL 25 MG PO CAPS
25.0000 mg | ORAL_CAPSULE | Freq: Three times a day (TID) | ORAL | Status: AC
  Administered 2011-11-09 (×3): 25 mg via ORAL
  Filled 2011-11-07 (×3): qty 1

## 2011-11-07 MED ORDER — TRAZODONE HCL 50 MG PO TABS
50.0000 mg | ORAL_TABLET | Freq: Every day | ORAL | Status: DC
  Administered 2011-11-07 – 2011-11-11 (×5): 50 mg via ORAL
  Filled 2011-11-07 (×2): qty 1
  Filled 2011-11-07 (×2): qty 14
  Filled 2011-11-07 (×4): qty 1
  Filled 2011-11-07: qty 14

## 2011-11-07 MED ORDER — VITAMIN B-1 100 MG PO TABS
100.0000 mg | ORAL_TABLET | Freq: Every day | ORAL | Status: DC
  Administered 2011-11-08 – 2011-11-12 (×5): 100 mg via ORAL
  Filled 2011-11-07 (×6): qty 1

## 2011-11-07 MED ORDER — CHLORDIAZEPOXIDE HCL 25 MG PO CAPS
25.0000 mg | ORAL_CAPSULE | Freq: Every day | ORAL | Status: AC
  Administered 2011-11-11: 25 mg via ORAL
  Filled 2011-11-07: qty 1

## 2011-11-07 MED ORDER — THIAMINE HCL 100 MG/ML IJ SOLN: 100.0000 mg | Freq: Once | INTRAMUSCULAR | Status: DC

## 2011-11-07 MED ORDER — CHLORDIAZEPOXIDE HCL 25 MG PO CAPS
25.0000 mg | ORAL_CAPSULE | ORAL | Status: AC
  Administered 2011-11-10 (×2): 25 mg via ORAL
  Filled 2011-11-07 (×2): qty 1

## 2011-11-07 MED ORDER — ADULT MULTIVITAMIN W/MINERALS CH
1.0000 | ORAL_TABLET | Freq: Every day | ORAL | Status: DC
  Administered 2011-11-07 – 2011-11-12 (×6): 1 via ORAL
  Filled 2011-11-07 (×5): qty 1

## 2011-11-07 MED ORDER — CHLORDIAZEPOXIDE HCL 25 MG PO CAPS
25.0000 mg | ORAL_CAPSULE | Freq: Four times a day (QID) | ORAL | Status: AC
  Administered 2011-11-07 – 2011-11-08 (×6): 25 mg via ORAL
  Filled 2011-11-07 (×6): qty 1
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
## 2011-11-07 NOTE — Tx Team (Signed)
Initial Interdisciplinary Treatment Plan  PATIENT STRENGTHS: (choose at least two) Ability for insight Average or above average intelligence Capable of independent living Motivation for treatment/growth Physical Health Work skills  PATIENT STRESSORS: Financial difficulties Occupational concerns Substance abuse   PROBLEM LIST: Problem List/Patient Goals Date to be addressed Date deferred Reason deferred Estimated date of resolution  Opiate abuse      ETOH abuse      Depression                                           DISCHARGE CRITERIA:  Ability to meet basic life and health needs Adequate post-discharge living arrangements Improved stabilization in mood, thinking, and/or behavior Motivation to continue treatment in a less acute level of care Need for constant or close observation no longer present Reduction of life-threatening or endangering symptoms to within safe limits Safe-care adequate arrangements made Verbal commitment to aftercare and medication compliance Withdrawal symptoms are absent or subacute and managed without 24-hour nursing intervention  PRELIMINARY DISCHARGE PLAN: Attend 12-step recovery group Outpatient therapy  PATIENT/FAMIILY INVOLVEMENT: This treatment plan has been presented to and reviewed with the patient, Darren Mendoza.  The patient has been given the opportunity to ask questions and make suggestions.  Arturo Morton 11/07/2011, 12:14 AM

## 2011-11-07 NOTE — Progress Notes (Signed)
Patient ID: Darren Mendoza, male   DOB: Jun 22, 1956, 56 y.o.   MRN: [id number] Pt denies SI/HI/AVH.  He stayed in his room in bed today and said he wasn't hungry at lunch.  Later, he complained of nausea, tremors, and body aches--given Robaxin, zofran, and atarax with results pending.  COWs in place.

## 2011-11-07 NOTE — [facility] Suicide Risk Assessment (Signed)
Suicide Risk Assessment  Admission Assessment     Demographic factors:  See chart.  Current Mental Status:  Patient seen and evaluated in his room. Chart reviewed. Patient stated that his mood was "ok". His affect was mood congruent and euthymic. He denied any current thoughts of self injurious behavior, suicidal ideation or homicidal ideation. There were no auditory or visual hallucinations, paranoia, delusional thought processes, or mania noted.  Thought process was linear and goal directed.  No psychomotor agitation or retardation was noted. His speech was normal rate, tone and volume. Eye contact was good. Judgment and insight are fair.  Patient has been up and engaged on the unit.  No acute safety concerns reported from team.  Loss Factors:  Loss Factors: Financial problems / change in socioeconomic status;Decrease in vocational status; homeless; separated from wife  Historical Factors: poor family local support; denied legal charges; denied hx SI/attempts/plans   Risk Reduction Factors:  Risk Reduction Factors: Positive coping skills or problem solving skills; pt interested in residential Tx  CLINICAL FACTORS: Opioid and Alcohol Dependence and W/D; r/o SIMD; c/o Insomnia  Meds:    . chlordiazePOXIDE  25 mg Oral QID   Followed by  . chlordiazePOXIDE  25 mg Oral TID   Followed by  . chlordiazePOXIDE  25 mg Oral BH-qamhs   Followed by  . chlordiazePOXIDE  25 mg Oral Daily  . cloNIDine  0.1 mg Oral QID   Followed by  . cloNIDine  0.1 mg Oral BH-qamhs   Followed by  . cloNIDine  0.1 mg Oral QAC breakfast  . mulitivitamin with minerals  1 tablet Oral Daily  . nicotine  21 mg Transdermal Q0600  . thiamine  100 mg Intramuscular Once  . thiamine  100 mg Oral Daily  . traZODone  50 mg Oral QHS  . DISCONTD: diphenhydrAMINE  50 mg Oral QHS,MR X 1    COGNITIVE FEATURES THAT CONTRIBUTE TO RISK: none.  SUICIDE RISK: Patient is currently viewed as a low risk of harm to himself and  others in light of his history and risk factors. There are no acute safety concerns and he is stable for detox. His continued sobriety, medication management and followup will mitigate against any potential increased risk in the future. He is agreeable with the plan.  Discussed with the team.  PLAN OF CARE: Pt admitted for crisis stabilization, detox and treatment.  Please see orders.   Medications reviewed with pt and medication education provided. Will continue q15 minute checks per unit protocol.  No clinical indication for one on one level of observation at this time.  Pt contracting for safety.  Mental health treatment, medication management and continued sobriety will mitigate against the increased risk of harm to self and/or others.  Discussed the importance of recovery with pt, as well as, tools to move forward in a healthy & safe manner.  Pt agreeable with the plan.  Discussed with the team.   Lupe Carney 11/07/2011, 11:51 AM

## 2011-11-07 NOTE — Progress Notes (Signed)
Pt has been visible in milieu, has had limited interaction or participation today, has had some complaints of withdrawal this evening, stating that he is feeling anxious, pt has received medications without incident, support provided, will continue to monitor

## 2011-11-07 NOTE — Progress Notes (Signed)
[name] Group Notes:  (Counselor/Nursing/MHT/Case Management/Adjunct)  11/07/2011   Type of Therapy:  Group Therapy at 11:00 AM and 1:15 PM  Participation Level:  Did Not Attend   [name] 11/07/2011, 4:12 PM

## 2011-11-07 NOTE — Progress Notes (Signed)
56yo male who presents voluntarily and in no acute distress for the treatment of Depression, ETOH and Opiate abuse. Appears blunted and depressed. Anxious and agitated during assessment. Halfway thru assessment, stated, "I thought I was here for detox?" and appeared frustrated with the depth of assessment scope. States he self presented to [facility] ED requesting detox and long term tx. States he ingests or snorts as many pain pills he can get on a daily basis. 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 Also states he drinks 2 22oz beers daily. Does endorse a depressed mood for some time, but flatly denies SI and contracts for safety. Denies HI/AVH. Mother is deceased and doesn't know his father. No brothers or sisters. COWS 11 on assessment. Medical history of renal disease, kidney stones and Left ankle surgery (plates and screws placed in October at [facility]. Skin is clear (old wee healed surgical wound to inner left ankle) with multiple tattoos. Smokes and "dips" tobacco daily. Patch ordered. Unit policies and expectations reviewed and understanding verbalized. Consents obtained. Belongings searched by Efraim Kaufmann, MHT and no contraband found. Escorted to unit and oriented by [facility], MHT. No questions or concerns expressed. Q15 minute safety checks initiated 11/06/11 at 2330.

## 2011-11-07 NOTE — Progress Notes (Signed)
Pt did not attend morning group. CM spoke with pt following group. Pt states that he would like to detox from alcohol and opiates. States that he has been using alcohol and opiates for years and denies any previous treatment. Pt states that he has been homeless living on the streets for a while and is not working. Pt has been a resident at a half way house before but states that he was kicked out because of not being able to pay rent. Pt states that he heard good things about [facility] and would like to follow up there after discharge. CM gave pt application to complete for [facility] program which he is also interested in.

## 2011-11-08 MED ORDER — TUBERCULIN PPD 5 UNIT/0.1ML ID SOLN
5.0000 [IU] | Freq: Once | INTRADERMAL | Status: AC
  Administered 2011-11-08: 5 [IU] via INTRADERMAL
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
## 2011-11-08 NOTE — ED Provider Notes (Signed)
Medical screening examination/treatment/procedure(s) were performed by non-physician practitioner and as supervising physician I was immediately available for consultation/collaboration.   Vida Roller, MD 11/08/11 [phone]

## 2011-11-08 NOTE — Progress Notes (Signed)
Pt on Clonidine/Librium protocol. Pt c/o body aches, chills and nausea, has been in bed most of the day. Pt did come to treatment team and talked about wanting residential treatment after discharge. PRNs as needed, denies SI/HI. VSS at this time.

## 2011-11-08 NOTE — Progress Notes (Signed)
Lying quietly in bed with eyes closed.  No objective signs of acute withdrawal.  Q 15 min safety checks in progress.

## 2011-11-08 NOTE — Treatment Plan (Signed)
Interdisciplinary Treatment Plan Update (Adult)  Date: 11/08/2011  Time Reviewed: 8:21 AM   Progress in Treatment: Attending groups: Yes Participating in groups: Yes Taking medication as prescribed: Yes Tolerating medication: Yes   Family/Significant othe contact made: Counselor to contact for SI prevention if needed  Patient understands diagnosis:  Yes  As evidenced by asking for help with substance abuse Discussing patient identified problems/goals with staff:  Yes  See below Medical problems stabilized or resolved:  Yes Denies suicidal/homicidal ideation: Yes  In tx team Issues/concerns per patient self-inventory:  Not filled out Other:  New problem(s) identified: N/A  Reason for Continuation of Hospitalization: Depression Medication stabilization Withdrawal symptoms  Interventions implemented related to continuation of hospitalization: Clonodine and librium detox protocol  Encourage group attendance and participation  Additional comments:  Estimated length of stay:3-4 days  Discharge Plan: [name] hopes to get into rehab  New goal(s): N/A  Review of initial/current patient goals per problem list:   1.  Goal(s):Safely detox from opiates and alcohol  Met:  No  Target date:2/25  As evidenced NW:GNFAOZHY in COWS and CIWA to 0  2.  Goal (s):Refer to rehab    Met:  No  Target date:2/22  As evidenced QM:VHQI application to [facility]  3.  Goal(s):  Met:  No  Target date:  As evidenced by:  4.  Goal(s):  Met:  No  Target date:  As evidenced by:  Attendees: Patient:  Darren Mendoza 11/08/2011 8:21 AM  Family:     Physician:  Lupe Carney 11/08/2011 8:21 AM   Nursing:   Carolynn Comment 11/08/2011 8:21 AM   Case Manager:  Richelle Ito, LCSW 11/08/2011 8:21 AM   Counselor:  Ronda Fairly, LCSWA 11/08/2011 8:21 AM   Other:  Tanya Nones 11/08/2011 8:21 AM  Other:     Other:     Other:      Scribe for Treatment Team:   Ida Rogue, 11/08/2011 8:21 AM

## 2011-11-08 NOTE — Progress Notes (Signed)
Patient ID: TARL CEPHAS, male   DOB: 1955/10/19, 56 y.o.   MRN: [id number] 11/08/2011  Nursing 1:1 D Pt is seen sitting outside 300 hall med window. She has a sad, flat affect. She makes no eye contact. When this nurse calls her name, she looks up, shakes her head and says " it's bad..its so bad" and then she starts sobbing. She says that she just found out that her aunt had died in her sleep. She weeps and says " just give me something...give me something please". A When this nurse asks if she can remain safe...she sates " I don't know.". She is medciated with 2 mg PO Ativan prn anxiety. R Safety is maintained with 1;1 MHT at her side. VS monitored q 4 hr per MD order. Continue to maintain therapeutic realtionship already established PD RN [facility]

## 2011-11-08 NOTE — Progress Notes (Signed)
[facility] MD Progress Note  11/08/2011 1:47 PM  S/O: Patient seen and evaluated in treatment team. Chart reviewed. Patient stated that his mood was "not good". His affect was mood congruent and anxious with sig w/d s/s. He denied any current thoughts of self injurious behavior, suicidal ideation or homicidal ideation. There were no auditory or visual hallucinations, paranoia, delusional thought processes, or mania noted. Thought process was linear and goal directed. No psychomotor agitation or retardation was noted. His speech was normal rate, tone and volume. Eye contact was good. Judgment and insight are fair. Patient has been up and engaged on the unit. No acute safety concerns reported from team.   Sleep:  Number of Hours: 6    Vital Signs:Blood pressure 97/63, pulse 75, temperature 97.6 F (36.4 C), temperature source Oral, resp. rate 20, height 5' 8.5" (1.74 m), weight 102.422 kg (225 lb 12.8 oz).  Current Medications:   . chlordiazePOXIDE  25 mg Oral QID   Followed by  . chlordiazePOXIDE  25 mg Oral TID   Followed by  . chlordiazePOXIDE  25 mg Oral BH-qamhs   Followed by  . chlordiazePOXIDE  25 mg Oral Daily  . cloNIDine  0.1 mg Oral QID   Followed by  . cloNIDine  0.1 mg Oral BH-qamhs   Followed by  . cloNIDine  0.1 mg Oral QAC breakfast  . mulitivitamin with minerals  1 tablet Oral Daily  . nicotine  21 mg Transdermal Q0600  . thiamine  100 mg Intramuscular Once  . thiamine  100 mg Oral Daily  . traZODone  50 mg Oral QHS  . DISCONTD: diphenhydrAMINE  50 mg Oral QHS,MR X 1    Lab Results: No results found for this or any previous visit (from the past 48 hour(s)).  Physical Findings: CIWA:  CIWA-Ar Total: 7  COWS:  COWS Total Score: 11   A/P: Opioid and Alcohol Dependence and W/D; r/o SIMD; c/o Insomnia   Treatment goals and medication management reviewed with patient.  Continue current treatment plan and medications.  No SEs reported at current dosages.  No acute medical or  psychiatric issues noted other than sig w/d. Pt agreeable with plan.  Discussed with team.  Lupe Carney 11/08/2011, 1:47 PM

## 2011-11-08 NOTE — Progress Notes (Signed)
Pt remained in bed until medication pass, at which time he came up and stated he is really having a lot of body aches and withdrawal symptoms.  Pt given medication as ordered including necessary prns.  No further complaints voiced, no acute distress noted.  Denies SI/HI/hallucinations.  Support and encouragement offered.  Will continue to monitor.

## 2011-11-08 NOTE — Progress Notes (Signed)
No family contact made by Clinical research associate as patient states there is no one in his life at this time.  Patient is seperated and estranged from wife, estranged from brother and last saw daughter 8 months previous. [name] 11/08/2011 9:48 AM

## 2011-11-08 NOTE — Progress Notes (Signed)
[name] Group Notes:  (Counselor/Nursing/MHT/Case Management/Adjunct)  11/08/2011 11:58 AM  Type of Therapy:  Group Therapy  Participation Level:  Did Not Attend  Participation Quality:  Appropriate  Affect:  Appropriate  Cognitive:  Appropriate  Insight:  None  Engagement in Group:  None  Engagement in Therapy:  None  Modes of Intervention:  Support  Summary of Progress/Problems:   Luanna Cole 11/08/2011, 11:58 AM

## 2011-11-08 NOTE — Progress Notes (Signed)
[name] Group Notes:  (Counselor/Nursing/MHT/Case Management/Adjunct)  11/08/2011 2:40 PM  Type of Therapy:  Group Therapy  Participation Level:  Did Not Attend   Darren Mendoza 11/08/2011, 2:40 PM

## 2011-11-09 ENCOUNTER — Encounter (HOSPITAL_COMMUNITY): Payer: Self-pay | Admitting: Physician Assistant

## 2011-11-09 DIAGNOSIS — F1123 Opioid dependence with withdrawal: Secondary | ICD-10-CM

## 2011-11-09 HISTORY — DX: Opioid dependence with withdrawal: F11.23

## 2011-11-09 MED ORDER — DICYCLOMINE HCL 20 MG PO TABS
20.0000 mg | ORAL_TABLET | Freq: Once | ORAL | Status: AC
  Administered 2011-11-09: 20 mg via ORAL
  Filled 2011-11-09: qty 1

## 2011-11-09 MED ORDER — METHOCARBAMOL 500 MG PO TABS
500.0000 mg | ORAL_TABLET | Freq: Once | ORAL | Status: AC
  Administered 2011-11-09: 500 mg via ORAL
  Filled 2011-11-09: qty 1

## 2011-11-09 MED ORDER — HYDROXYZINE HCL 25 MG PO TABS
25.0000 mg | ORAL_TABLET | Freq: Once | ORAL | Status: AC
  Administered 2011-11-09: 25 mg via ORAL

## 2011-11-09 MED ORDER — CHLORDIAZEPOXIDE HCL 25 MG PO CAPS
25.0000 mg | ORAL_CAPSULE | Freq: Once | ORAL | Status: AC
  Administered 2011-11-09: 25 mg via ORAL
  Filled 2011-11-09: qty 1

## 2011-11-09 NOTE — Progress Notes (Signed)
[name] Group Notes:  (Counselor/Nursing/MHT/Case Management/Adjunct)  11/09/2011 3:27 PM  Type of Therapy:  Group Therapy  Participation Level:  Did Not Attend    Purcell Nails 11/09/2011, 3:27 PM

## 2011-11-09 NOTE — Progress Notes (Signed)
Pt has had severe withdrawal symptoms this shift including stomach cramps,irritability, and generalized anxiety.  PRN medications given along with one time meds ordered by provider.  Did attend RN group.  Denies SI. Also inquired about long term tx due to homelessness.  Encouraged fluids. Support and encouragement given. 15' checks cont for safety.

## 2011-11-09 NOTE — Progress Notes (Signed)
[facility] MD Progress Note  11/09/2011 2:12 PM  Diagnosis:  Opiate dependence                      Alcohol dependence  Sleep: Poor  Appetite:  Poor  Suicidal Ideation:  none Homicidal Ideation:  none Darren Mendoza states that he is having a "terrible detox and that he is very sick and needs something stronger."  He is also asking about Suboxone.  He states he is having a "jumpy stomach".   Sleep:  Number of Hours: 6    Vital Signs:Blood pressure 92/57, pulse 70, temperature 98.2 F (36.8 C), temperature source Oral, resp. rate 17, height 5' 8.5" (1.74 m), weight 102.422 kg (225 lb 12.8 oz). Pt. Is pale and diaphoretic, but pulse is stable.  He is alert and oriented.  He is ambulating well. His speech is clear and goal directed.  He does look sick, but not toxic. Current Medications: Current Facility-Administered Medications  Medication Dose Route Frequency Provider Last Rate Last Dose  . acetaminophen (TYLENOL) tablet 650 mg  650 mg Oral Q6H PRN Syed T. Arfeen, MD      . alum & mag hydroxide-simeth (MAALOX/MYLANTA) 200-200-20 MG/5ML suspension 30 mL  30 mL Oral Q4H PRN Syed T. Arfeen, MD      . chlordiazePOXIDE (LIBRIUM) capsule 25 mg  25 mg Oral QID Alyson Kuroski-Mazzei, DO   25 mg at 11/08/11 2208   Followed by  . chlordiazePOXIDE (LIBRIUM) capsule 25 mg  25 mg Oral TID Alyson Kuroski-Mazzei, DO   25 mg at 11/09/11 1213   Followed by  . chlordiazePOXIDE (LIBRIUM) capsule 25 mg  25 mg Oral BH-qamhs Alyson Kuroski-Mazzei, DO       Followed by  . chlordiazePOXIDE (LIBRIUM) capsule 25 mg  25 mg Oral Daily Alyson Kuroski-Mazzei, DO      . chlordiazePOXIDE (LIBRIUM) capsule 25 mg  25 mg Oral Once [employer], PA   25 mg at 11/09/11 1233  . cloNIDine (CATAPRES) tablet 0.1 mg  0.1 mg Oral QID Syed T. Arfeen, MD   0.1 mg at 11/09/11 0818   Followed by  . cloNIDine (CATAPRES) tablet 0.1 mg  0.1 mg Oral BH-qamhs Syed T. Arfeen, MD       Followed by  . cloNIDine (CATAPRES) tablet 0.1 mg  0.1 mg  Oral QAC breakfast Syed T. Arfeen, MD      . dicyclomine (BENTYL) tablet 20 mg  20 mg Oral Q4H PRN Syed T. Arfeen, MD   20 mg at 11/09/11 0928  . dicyclomine (BENTYL) tablet 20 mg  20 mg Oral Once [employer], [place]      . hydrOXYzine (ATARAX/VISTARIL) tablet 25 mg  25 mg Oral Q6H PRN Syed T. Arfeen, MD   25 mg at 11/09/11 1191  . hydrOXYzine (ATARAX/VISTARIL) tablet 25 mg  25 mg Oral Once [employer], [place]   25 mg at 11/09/11 1233  . loperamide (IMODIUM) capsule 2-4 mg  2-4 mg Oral PRN Syed T. Arfeen, MD      . magnesium hydroxide (MILK OF MAGNESIA) suspension 30 mL  30 mL Oral Daily PRN Syed T. Arfeen, MD      . methocarbamol (ROBAXIN) tablet 500 mg  500 mg Oral Q8H PRN Syed T. Arfeen, MD   500 mg at 11/08/11 2207  . methocarbamol (ROBAXIN) tablet 500 mg  500 mg Oral Once Verne Spurr, PA   500 mg at 11/09/11 1233  . mulitivitamin with minerals tablet 1 tablet  1 tablet Oral Daily Alyson Kuroski-Mazzei, DO   1 tablet at 11/09/11 0818  . naproxen (NAPROSYN) tablet 500 mg  500 mg Oral BID PRN Syed T. Arfeen, MD      . nicotine (NICODERM CQ - dosed in mg/24 hours) patch 21 mg  21 mg Transdermal Q0600 Syed T. Arfeen, MD   21 mg at 11/09/11 0605  . ondansetron (ZOFRAN-ODT) disintegrating tablet 4 mg  4 mg Oral Q6H PRN Syed T. Arfeen, MD   4 mg at 11/08/11 2209  . thiamine (B-1) injection 100 mg  100 mg Intramuscular Once [employer], DO      . thiamine (VITAMIN B-1) tablet 100 mg  100 mg Oral Daily Alyson Kuroski-Mazzei, DO   100 mg at 11/09/11 0818  . traZODone (DESYREL) tablet 50 mg  50 mg Oral QHS Alyson Kuroski-Mazzei, DO   50 mg at 11/08/11 2208  . tuberculin injection 5 Units  5 Units Intradermal Once Verne Spurr, [place]   5 Units at 11/08/11 1445    Lab Results: No results found for this or any previous visit (from the past 48 hour(s)). CIWA:  CIWA-Ar Total: 10  COWS:  COWS Total Score: 11   Treatment Plan Summary: After review of the patient's vital signs and talking with the  patient, his MAR is reviewed.  After discussion with RN and Pharm D. He will get a 1 time dose of Librium, Bentyl, Robaxin, and vistaril.  He is encouraged to increase his hydration and rest as needed. Daily contact with patient to assess and evaluate symptoms and progress in treatment Medication management  Plan: As noted.  [name] 11/09/2011, 2:12 PM

## 2011-11-09 NOTE — Progress Notes (Signed)
10:24 AM 11/09/2011         Case Management Note                                 Pt attended after care planning group and received suicide prevention, AA and NA meeting information. Pt denies SI/HI. Pt was able to share that he was "feeling like shit" due to the horrible withdrawal symptoms he was experiencing including shakes, chills and cramps. Pt states he has been detoxing from opiates for 2-3 days and feels he needs meds for pain. Pt encouraged to talk to doctor and given support. Bethany Cumming, LPCA

## 2011-11-10 MED ORDER — ENSURE PUDDING PO PUDG
1.0000 | Freq: Three times a day (TID) | ORAL | Status: DC
  Administered 2011-11-10 – 2011-11-11 (×5): 1 via ORAL
  Filled 2011-11-10 (×9): qty 1

## 2011-11-10 MED ORDER — NICOTINE POLACRILEX 2 MG MT GUM
2.0000 mg | CHEWING_GUM | OROMUCOSAL | Status: DC | PRN
  Administered 2011-11-10 – 2011-11-12 (×7): 2 mg via ORAL
  Filled 2011-11-10 (×3): qty 1

## 2011-11-10 NOTE — Progress Notes (Signed)
Writer observed patient in his room preparing to attend AA group. Writer introduced self and informed patient that I would be his nurse for the shift. Patient c/o feeling very anxious and he was having a rough time with his detox. Writer asked patient if he felt that he needed medication for anxiety before group and he reported that it would help him to calm down some. Patient was given a prn of visteril before attending group and informed of his scheduled medication to which he was agreeable to taking at the due time. Patient currently denies pain, -si/hi/a/v hall. Safety maintained on unit, will continue to monitor.

## 2011-11-10 NOTE — Progress Notes (Signed)
Patient ID: Darren Mendoza, male   DOB: 31-Mar-1956, 56 y.o.   MRN: [id number] [facility] MD Progress Note  11/10/2011 11:56 AM   Diagnosis:  Axis I: Hospital Opiate withdrawal Opiate addiction Homeless Alcohol dependence   ADL's:  Intact  Sleep:  No patient says he slept maybe 30 minutes last night.  Appetite: poor. Suicidal Ideation:  No                Plan                Intent                      Means           Homicidal Ideation:   No   Plan:  No  Intent:  No  Means:  No  AEB (as evidenced by): Patient states he is craving chocolate, and is requesting Ensure to be added as his appetite is "poor." Then he negates his claim when he states that he is usually the 4th or 5th person in line in the cafeteria. Mental Status: alert and oriented x 3 General Appearance / casually dressed Behavior:  Pt. States he feels "awful" but statement is out of proportion to how he appears and behaves. Requesting more medication for symptoms of withdrawal.  Eye Contact:  Fair Motor Behavior:  Normal Speech:  Normal Level of Consciousness:  Alert Mood:  Anxious and Depressed Affect:  Appropriate Anxiety Level:  Minimal Thought Process:  Coherent goal directed for seeking medication. Thought Content:  WNL Perception:  Normal Judgment:  Fair Insight:  Present Cognition:  Orientation time, place and person Sleep:  Number of Hours: 3.25   Vital Signs:Blood pressure 96/73, pulse 84, temperature 98 F (36.7 C), temperature source Oral, resp. rate 24, height 5' 8.5" (1.74 m), weight 102.422 kg (225 lb 12.8 oz).  Lab Results: No results found for this or any previous visit (from the past 48 hour(s)).  Physical Findings:  Upon being observed but unaware, the patient is much "perkier" and "livelier" when interacting with male patients on the unit than when he presented to this provider just seconds before. CIWA:  CIWA-Ar Total: 6  COWS:  COWS Total Score: 8   Treatment Plan Summary: Daily  contact with patient to assess and evaluate symptoms and progress in treatment Medication management  Plan: Pt. Is reassured and his vital signs and CIWA and COWS scores are reviewed with him.  The protocols are reviewed with him and he is encouraged to take one day at a time.  Rona Ravens. Shontavia Mickel PAC 11/10/2011, 11:56 AM

## 2011-11-10 NOTE — Progress Notes (Signed)
Patient ID: Darren Mendoza, male   DOB: 1956-07-25, 56 y.o.   MRN: [id number]  [facility] Group Notes:  (Counselor/Nursing/MHT/Case Management/Adjunct)  11/10/2011 1:15 PM  Type of Therapy:  Group Therapy, Dance/Movement Therapy   Participation Level:  Active  Participation Quality:  Appropriate  Affect:  Appropriate  Cognitive:  Appropriate  Insight:  Good  Engagement in Group:  Good  Engagement in Therapy:  Good  Modes of Intervention:  Clarification, Problem-solving, Role-play, Socialization and Support  Summary of Progress/Problems:  Therapist discussed with group the definition of healthy supports. Therapist asked group what does healthy supports mean to them  Pt. stated that a healthy support means " always having someone around but not making them a crutch to lean on".      Rhunette Croft

## 2011-11-10 NOTE — Progress Notes (Signed)
Pt is up and out in dayroom interacting with peers ans attending groups.  Continues to be resisitant to early recovery concepts and is focused on physical manifestations of withdrawal. C/O anxiety,stomach cramps, and insomnia. Principles of PAWS explained and prn medications requested. Observed laughing in group.  Cont support,encouragement and education. Cont 15' checks for safety.

## 2011-11-10 NOTE — Progress Notes (Signed)
Patient was observed by writer sitting in the dayroom interacting with peers. Writer spoke with patient 1:1 in the medication room and patient reports how he has had a rough day with his withdrawals. Patient is reminded of certain withdrawal symptoms he may experience and how his prn medications will help with his withdrawals but will not take them away. Patient c/o felling anxious and is reminded that it is too soon for another dose of visteril. Patient then c/o having a nervous feeling stomach and muscles cramping. Patient is given a prn dose of Bentyl and Robaxin for his withdrawal symptoms. Patient is informed of his scheduled hs medications and the Librium that has been added and is explained what this medication is used for. Patient is agreeable with his medications. Currently denies having si/hi/a/v hall. Safety maintained onunit, will continue to monitor.

## 2011-11-11 NOTE — Progress Notes (Signed)
[name] Group Notes:  (Counselor/Nursing/MHT/Case Management/Adjunct)  11/11/2011   Type of Therapy:  Group Therapy at 11:00  Participation Level:  None  Participation Quality:  Patient present for introduction of group, left to return at closing and ask about discharge matters  [name] 11/11/2011, 3:20 PM  [name] Group Notes:  (Counselor/Nursing/MHT/Case Management/Adjunct)  11/11/2011   Type of Therapy:  Group Therapy  Participation Level:  Did Not Attend    [name] 11/11/2011, 3:20 PM

## 2011-11-11 NOTE — Progress Notes (Signed)
Patient requesting to sign out today before lunch was informed of processes as he would need assessment by psychiatrist before leaving.  Pt later stated he wants to discharge to [facility] and referred to his social work case Production designer, theatre/television/film as he reports multiple discharge alternatives for today.  [name] 11/11/2011 3:24 PM

## 2011-11-11 NOTE — Discharge Planning (Signed)
Today in AM group let pt know that [facility] hs no current openings.  He was disappointed.  Let him know I will call again tomorrow as they have frequent openings.  We agreed that I would also call [facility] re: a bed, but when he told me his is still experiencing withdrawal symptoms, I let him know that I cannot call at 9:15 to find out about a bed as he needs to see the Dr and be cleared for d/c.  Likely d/c tomorrow.

## 2011-11-11 NOTE — Progress Notes (Signed)
Pt. Denies SI/HI and denies A/V hallucinations.  Reports anxious.  Antianxiety medication prn given.  Support given.

## 2011-11-11 NOTE — Progress Notes (Signed)
Pt. Standing at medication window receiving his night time medication.  Pt. Requesting medication for withdrawal.  Pt. Stated that other facilities give Suboxone and Methadone.  Pt. Took his scheduled medication and then reached in his pocket and took a pill from out of his pocket of unknown origin and swallowed it.  Writer asked pt. What was the medication he had just taken and where did he get it from.  Pt. Told writer that it was a 1/2 of a rolaid and that he had it in his pocket .  Pt . Stated that he didn't have anymore.  Nw[place] NP notified.  UDS stat ordered and pt.'s room and belongings search.  No other medications were found.

## 2011-11-12 DIAGNOSIS — F1120 Opioid dependence, uncomplicated: Principal | ICD-10-CM

## 2011-11-12 DIAGNOSIS — F1020 Alcohol dependence, uncomplicated: Secondary | ICD-10-CM

## 2011-11-12 LAB — RAPID URINE DRUG SCREEN, HOSP PERFORMED
Amphetamines: NOT DETECTED
Barbiturates: NOT DETECTED
Benzodiazepines: POSITIVE — AB

## 2011-11-12 MED ORDER — TRAZODONE HCL 50 MG PO TABS: 50.0000 mg | ORAL_TABLET | Freq: Every day | ORAL | Status: DC

## 2011-11-12 NOTE — Progress Notes (Signed)
Patient resting quietly with eyes closed. Respirations even and unlabored. No distress noted. Q 15 minute check continues to maintain safety   

## 2011-11-12 NOTE — Progress Notes (Signed)
Recreation Therapy Group Note  Date: 11/12/2011          Time: 1000       Group Topic/Focus: Patient invited to participate in animal assisted therapy. Pets as a coping skill and responsibility were discussed.   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 

## 2011-11-12 NOTE — Progress Notes (Signed)
[facility] Case Management Discharge Plan:  Will you be returning to the same living situation after discharge: No. At discharge, do you have transportation home?:Yes,  car Do you have the ability to pay for your medications:Yes,  $4 [employer] list  Interagency Information:     Release of information consent forms completed and in the chart;  Patient's signature needed at discharge.  Patient to Follow up at:  Follow-up Information    Follow up with [facility] on 11/12/2011. (Be there a 2 for admission)    Contact information:   [address]  [facility]  [address]    [phone]         Patient denies SI/HI:   Yes,  yes    Safety Planning and Suicide Prevention discussed:  Yes,  yes  Barrier to discharge identified:No.  Summary and Recommendations:   Darren Mendoza 11/12/2011, 11:27 AM

## 2011-11-12 NOTE — Progress Notes (Signed)
Patient ID: Darren Mendoza, male   DOB: 10-Nov-1955, 56 y.o.   MRN: [id number]   [facility] MD Progress Note  S/O: Patient seen and evaluated. Chart reviewed. Patient stated that his mood was "better and he [facility]  Explored various residential Tx options with pt, including back up plan at [facility].  His affect was mood congruent and stable. He denied any current thoughts of self injurious behavior, suicidal ideation or homicidal ideation. There were no auditory or visual hallucinations, paranoia, delusional thought processes, or mania noted. Thought process was linear and goal directed. No psychomotor agitation or retardation was noted. His speech was normal rate, tone and volume. Eye contact was good. Judgment and insight are fair. Patient has been up and engaged on the unit. No acute safety concerns reported from team.   Current Medications:    . cloNIDine  0.1 mg Oral QAC breakfast  . feeding supplement  1 Container Oral TID WC  . mulitivitamin with minerals  1 tablet Oral Daily  . thiamine  100 mg Intramuscular Once  . thiamine  100 mg Oral Daily  . traZODone  50 mg Oral QHS    A/P: Opioid and Alcohol Dependence; SIMD, resolving   Treatment goals and medication management reviewed with patient.  Continue current treatment plan and medications.  No SEs reported at current dosages.  No acute medical or psychiatric issues noted. Pt agreeable with plan.  Discussed with team.  Potential d/c on 2/26 pending placement.

## 2011-11-12 NOTE — Discharge Summary (Signed)
Physician Discharge Summary Note  Patient:  Darren Mendoza is an 56 y.o., male MRN:  [id number] DOB:  1955-12-31 Patient phone:  [phone] (home)  Patient address:   [address] [place] [address],   Date of Admission:  11/06/2011 Date of Discharge: 11/12/11  Reason for Admission: Detox from Opiates  Discharge Diagnoses: Principal Problem:  *Opiate addiction Active Problems:  Homeless  Alcohol dependence  Opiate withdrawal   Axis Diagnosis:   AXIS I:  Opiate addiction AXIS II:  Deferred AXIS III:   Past Medical History  Diagnosis Date  . Renal disorder   . Kidney stones   . Anxiety   . Depression   . Opiate withdrawal 11/09/2011   AXIS IV:  other psychosocial or environmental problems AXIS V:  70  Level of Care:  [facility]  Hospital Course:   Pt. Presented to ED requesting help with detoxing from opiates. He has used oral opiates or snorted crushed pills of Morphine, Percocet, Vicodin,Opana daily for 2 years. While a patient in this hospital, patient was started on the clonidine protocol to assist with the gradual withdrawal symptoms of Opiates. Patient did well and tolerated the treatment. He also participated in group counseling and activities.  He is being discharged today as patient agreed with the treatment team that he is stable for discharge , however will continue substance abuse treatment at [facility] to reach and maintain full sobreity. Patient is made aware to report to [facility] at 2:00 PM today so that admission to [facility] process will be initiated. He is discharged on Trazodone 50 mg by mouth at bedtime for sleep. Patient upon discharge denies suicidal and homicidal ideations. He denies any auditory, visual and or tactile hallucinations. Patient left [facility] with all personal belongings via personal arranged tranportation. He was in no apparent distress.  Consults:  None  Significant Diagnostic Studies:  None  Discharge Vitals:   Blood pressure 115/73, pulse 60,  temperature 96.8 F (36 C), temperature source Oral, resp. rate 20, height 5' 8.5" (1.74 m), weight 102.422 kg (225 lb 12.8 oz).  Mental Status Exam: See Mental Status Examination and Suicide Risk Assessment completed by Attending Physician prior to discharge.  Discharge destination:  [facility] (Residential treatment center)  Is patient on multiple antipsychotic therapies at discharge:  No   Has Patient had three or more failed trials of antipsychotic monotherapy by history:  No  Recommended Plan for Multiple Antipsychotic Therapies: NA   Medication List  As of 11/12/2011  1:42 PM   TAKE these medications      Indication    traZODone 50 MG tablet   Commonly known as: DESYREL   Take 1 tablet (50 mg total) by mouth at bedtime. For sleep            Follow-up Information    Follow up with [facility] on 11/12/2011. (Be there a 2 for admission)    Contact information:   [address]  [facility]  [address]    [phone]         Follow-up recommendations:  Keep follow-up appointment as recommended.                                                      Activities as tolerated  Comments:  Take all discharge medications as prescribed.  Report any adverse effects from medications to your outpatient provider promptly.  Signed: Armandina Stammer I 11/12/2011, 1:42 PM

## 2011-11-12 NOTE — Progress Notes (Signed)
Pt to be D/Ced today. 

## 2011-11-12 NOTE — [facility] Suicide Risk Assessment (Signed)
Suicide Risk Assessment  Discharge Assessment     Demographic factors:  See chart.  Current Mental Status:  Patient seen and evaluated in team. Chart reviewed. Patient stated that his mood was "good". His affect was mood congruent and euthymic. He denied any current thoughts of self injurious behavior, suicidal ideation or homicidal ideation. There were no auditory or visual hallucinations, paranoia, delusional thought processes, or mania noted.  Thought process was linear and goal directed.  No psychomotor agitation or retardation was noted. His speech was normal rate, tone and volume. Eye contact was good. Judgment and insight are fair.  Patient has been up and engaged on the unit.  No acute safety concerns reported from team.  Loss Factors:  Loss Factors: Financial problems / change in socioeconomic status;Decrease in vocational status; homeless; separated from wife  Historical Factors: poor family local support; denied legal charges; denied hx SI/attempts/plans   Risk Reduction Factors:  Risk Reduction Factors: Positive coping skills or problem solving skills; pt interested in residential Tx - transitioning to [place]  CLINICAL FACTORS: Opioid and Alcohol Dependence; SIMD  Meds:    . cloNIDine  0.1 mg Oral QAC breakfast  . feeding supplement  1 Container Oral TID WC  . mulitivitamin with minerals  1 tablet Oral Daily  . thiamine  100 mg Intramuscular Once  . thiamine  100 mg Oral Daily  . traZODone  50 mg Oral QHS    COGNITIVE FEATURES THAT CONTRIBUTE TO RISK: none.  SUICIDE RISK: Patient is currently viewed as a low risk of harm to himself and others in light of his history and risk factors. There are no acute safety concerns and he is stable for discharge. His continued sobriety, medication management and followup will mitigate against any potential increased risk in the future. He is agreeable with the plan.  Discussed with the team.  VS: Filed Vitals:   11/12/11 0730  BP: 115/73   Pulse: 60  Temp: 96.8 F (36 C)  Resp:     PLAN OF CARE: Pt stable for and requesting discharge. Pt contracting for safety and does not currently meet  involuntary commitment criteria for continued hospitalization.  Mental health treatment, medication management and continued sobriety will mitigate against the increased risk of harm to self and/or others.  Discussed the importance of recovery further with pt, as well as, tools to move forward in a healthy & safe manner.  Pt agreeable with the plan.  Discussed with the team.  Please see orders, follow up plans per team and full discharge summary completed by physician extender.   Lupe Carney 11/12/2011, 11:52 AM

## 2011-11-12 NOTE — Treatment Plan (Signed)
Interdisciplinary Treatment Plan Update (Adult)  Date: 11/12/2011  Time Reviewed: 11:18 AM   Progress in Treatment: Attending groups: Yes Participating in groups: Yes Taking medication as prescribed: Yes Tolerating medication: Yes   Family/Significant othe contact made:  No Patient understands diagnosis:  Yes  As evidenced by asking for help with opiate addiction Discussing patient identified problems/goals with staff:  Yes  See below Medical problems stabilized or resolved:  Yes Denies suicidal/homicidal ideation: Yes  In tx team Issues/concerns per patient self-inventory:  Requests d/c today Other:  New problem(s) identified: N/A  Reason for Continuation of Hospitalization: Other; describe D/C today  Interventions implemented related to continuation of hospitalization:   Additional comments:  Estimated length of stay: D/C today  Discharge Plan:Transfer to [facility]  New goal(s): N/A  Review of initial/current patient goals per problem list:   1.  Goal(s):Safely detox from opiates  Met:  Yes  Target date2/26:  As evidenced [id number] in COWS score to 0/self report  2.  Goal (s):Refer to rehab  Met:  Yes  Target date:2/26  As evidenced by:[name] is accepted at [facility]  3.  Goal(s):  Met:  Yes  Target date:  As evidenced by:  4.  Goal(s):  Met:  Yes  Target date:  As evidenced by:  Attendees: Patient:  Darren Mendoza 11/12/2011 11:18 AM  Family:     Physician:  Lupe Carney 11/12/2011 11:18 AM   Nursing: Carolynn Comment   11/12/2011 11:18 AM   Case Manager:  Richelle Ito, LCSW 11/12/2011 11:18 AM   Counselor:  Ronda Fairly, LCSWA 11/12/2011 11:18 AM   Other:     Other:     Other:     Other:      Scribe for Treatment Team:   Ida Rogue, 11/12/2011 11:18 AM

## 2011-11-15 NOTE — Progress Notes (Signed)
Patient Discharge Instructions:  Psychiatric Admission Assessment Note Faxed,  11/15/2011 Discharge Summary Note Faxed,   11/15/2011 After Visit Summary (AVS) Faxed,  11/15/2011 Face Sheet Faxed, 11/15/2011 Faxed to the Next Level Care provider:  11/15/2011  Faxed to [facility] @ [phone]  Wandra Scot, 11/15/2011, 12:14 PM

## 2011-11-20 ENCOUNTER — Emergency Department (HOSPITAL_COMMUNITY)
Admission: EM | Admit: 2011-11-20 | Discharge: 2011-11-21 | Disposition: A | Discharge status: Home Health | Payer: Self-pay | Attending: Emergency Medicine | Admitting: Emergency Medicine

## 2011-11-20 ENCOUNTER — Encounter (HOSPITAL_COMMUNITY): Payer: Self-pay | Admitting: Emergency Medicine

## 2011-11-20 DIAGNOSIS — M25473 Effusion, unspecified ankle: Secondary | ICD-10-CM | POA: Insufficient documentation

## 2011-11-20 DIAGNOSIS — M25476 Effusion, unspecified foot: Secondary | ICD-10-CM | POA: Insufficient documentation

## 2011-11-20 DIAGNOSIS — M25572 Pain in left ankle and joints of left foot: Secondary | ICD-10-CM

## 2011-11-20 DIAGNOSIS — F341 Dysthymic disorder: Secondary | ICD-10-CM | POA: Insufficient documentation

## 2011-11-20 DIAGNOSIS — Z79899 Other long term (current) drug therapy: Secondary | ICD-10-CM | POA: Insufficient documentation

## 2011-11-20 DIAGNOSIS — M25579 Pain in unspecified ankle and joints of unspecified foot: Secondary | ICD-10-CM | POA: Insufficient documentation

## 2011-11-20 MED ORDER — GABAPENTIN 600 MG PO TABS: 600.0000 mg | ORAL_TABLET | Freq: Three times a day (TID) | ORAL | Status: DC

## 2011-11-20 MED ORDER — KETOROLAC TROMETHAMINE 60 MG/2ML IM SOLN
60.0000 mg | Freq: Once | INTRAMUSCULAR | Status: AC
  Administered 2011-11-20: 60 mg via INTRAMUSCULAR
  Filled 2011-11-20: qty 2

## 2011-11-20 NOTE — Discharge Instructions (Signed)
Use the splint as needed for comfort. Use the higher dose of gabapentin. See your primary care doctor or the orthopedist of your choice if not better in one week.  Arthralgia Your caregiver has diagnosed you as suffering from an arthralgia. Arthralgia means there is pain in a joint. This can come from many reasons including:  Bruising the joint which causes soreness (inflammation) in the joint.   Wear and tear on the joints which occur as we grow older (osteoarthritis).   Overusing the joint.   Various forms of arthritis.   Infections of the joint.  Regardless of the cause of pain in your joint, most of these different pains respond to anti-inflammatory drugs and rest. The exception to this is when a joint is infected, and these cases are treated with antibiotics, if it is a bacterial infection. HOME CARE INSTRUCTIONS   Rest the injured area for as long as directed by your caregiver. Then slowly start using the joint as directed by your caregiver and as the pain allows. Crutches as directed may be useful if the ankles, knees or hips are involved. If the knee was splinted or casted, continue use and care as directed. If an stretchy or elastic wrapping bandage has been applied today, it should be removed and re-applied every 3 to 4 hours. It should not be applied tightly, but firmly enough to keep swelling down. Watch toes and feet for swelling, bluish discoloration, coldness, numbness or excessive pain. If any of these problems (symptoms) occur, remove the ace bandage and re-apply more loosely. If these symptoms persist, contact your caregiver or return to this location.   For the first 24 hours, keep the injured extremity elevated on pillows while lying down.   Apply ice for 15 to 20 minutes to the sore joint every couple hours while awake for the first half day. Then 3 to 4 times per day for the first 48 hours. Put the ice in a plastic bag and place a towel between the bag of ice and your skin.    Wear any splinting, casting, elastic bandage applications, or slings as instructed.   Only take over-the-counter or prescription medicines for pain, discomfort, or fever as directed by your caregiver. Do not use aspirin immediately after the injury unless instructed by your physician. Aspirin can cause increased bleeding and bruising of the tissues.   If you were given crutches, continue to use them as instructed and do not resume weight bearing on the sore joint until instructed.  Persistent pain and inability to use the sore joint as directed for more than 2 to 3 days are warning signs indicating that you should see a caregiver for a follow-up visit as soon as possible. Initially, a hairline fracture (break in bone) may not be evident on X-rays. Persistent pain and swelling indicate that further evaluation, non-weight bearing or use of the joint (use of crutches or slings as instructed), or further X-rays are indicated. X-rays may sometimes not show a small fracture until a week or 10 days later. Make a follow-up appointment with your own caregiver or one to whom we have referred you. A radiologist (specialist in reading X-rays) may read your X-rays. Make sure you know how you are to obtain your X-ray results. Do not assume everything is normal if you do not hear from [place]. SEEK MEDICAL CARE IF: Bruising, swelling, or pain increases. SEEK IMMEDIATE MEDICAL CARE IF:   Your fingers or toes are numb or blue.   The  pain is not responding to medications and continues to stay the same or get worse.   The pain in your joint becomes severe.   You develop a fever over 102 F (38.9 C).   It becomes impossible to move or use the joint.  MAKE SURE YOU:   Understand these instructions.   Will watch your condition.   Will get help right away if you are not doing well or get worse.  Document Released: 09/02/2005 Document Revised: 08/22/2011 Document Reviewed: 04/20/2008 [facility] Patient Information  2012 [place], [place].

## 2011-11-20 NOTE — ED Notes (Signed)
MD at bedside. 

## 2011-11-20 NOTE — ED Notes (Signed)
Pt states he is having pain and swelling to his left ankle  Denies injury  States has had surgery on it before and screws and plates placed in it about a year ago  Pt states he was given neurontin by a dr at [facility] for the pain but it has not helped  Pt is currently at [facility] detoxing off opiates

## 2011-11-20 NOTE — ED Provider Notes (Signed)
History     CSN: [id number]  Arrival date & time 11/20/11  2206   First MD Initiated Contact with Patient 11/20/11 2302      Chief Complaint  Patient presents with  . Ankle Pain    (Consider location/radiation/quality/duration/timing/severity/associated sxs/prior treatment) HPI Comments: Darren Mendoza is a 56 y.o. Male who has had left ankle swelling. For one week, associated with lots of walking. He is in a rehabilitation facility for detox from opiates. He does not want to get narcotic medication. He has tried Advil without relief. He would like to explore increasing his Gabapentin dose to see if that helps the discomfort. Has had no fall, fever, chills, nausea, vomiting.  The history is provided by the patient.    Past Medical History  Diagnosis Date  . Renal disorder   . Kidney stones   . Anxiety   . Depression   . Opiate withdrawal 11/09/2011    Past Surgical History  Procedure Date  . L ankle surgery     Plates and screws placed last October at [facility]  . Reconstructive surgery r great toe     Family History  Problem Relation Age of Onset  . Heart failure Mother   . Anesthesia problems Neg Hx   . Hypotension Neg Hx   . Malignant hyperthermia Neg Hx   . Pseudochol deficiency Neg Hx     History  Substance Use Topics  . Smoking status: Current Everyday Smoker -- 0.2 packs/day for 15 years    Types: Cigarettes  . Smokeless tobacco: Current User    Types: Snuff  . Alcohol Use: No      Review of Systems  All other systems reviewed and are negative.    Allergies  Review of patient's allergies indicates no known allergies.  Home Medications   Current Outpatient Rx  Name Route Sig Dispense Refill  . HYDROXYZINE PAMOATE 50 MG PO CAPS Oral Take 75 mg by mouth 3 (three) times daily as needed.    . IBUPROFEN 800 MG PO TABS Oral Take 800 mg by mouth every 8 (eight) hours as needed.    . METHOCARBAMOL 750 MG PO TABS Oral Take 1,500 mg by mouth 4 (four)  times daily. As needed    . ONE-DAILY MULTI VITAMINS PO TABS Oral Take 1 tablet by mouth daily.    Marland Kitchen GABAPENTIN 600 MG PO TABS Oral Take 1 tablet (600 mg total) by mouth 3 (three) times daily. 90 tablet 0  . TRAZODONE HCL 50 MG PO TABS Oral Take 100 mg by mouth at bedtime. For sleep      BP 150/90  Pulse 85  Temp(Src) 98.6 F (37 C) (Oral)  Resp 20  SpO2 97%  Physical Exam  Nursing note and vitals reviewed. Constitutional: He is oriented to person, place, and time. He appears well-developed and well-nourished.  HENT:  Head: Normocephalic and atraumatic.  Right Ear: External ear normal.  Left Ear: External ear normal.  Eyes: Conjunctivae and EOM are normal. Pupils are equal, round, and reactive to light.  Neck: Normal range of motion and phonation normal. Neck supple.  Pulmonary/Chest: Effort normal. He exhibits no bony tenderness.  Abdominal: Normal appearance.  Musculoskeletal:       Lef tankle tender and swollen medial and lateral with pain on PROM. Bilateral LE edema 2+  Neurological: He is alert and oriented to person, place, and time. He has normal strength. No cranial nerve deficit or sensory deficit. He exhibits normal muscle  tone. Coordination normal.  Skin: Skin is warm, dry and intact.  Psychiatric: He has a normal mood and affect. His behavior is normal. Judgment and thought content normal.    ED Course  Procedures (including critical care time) Urgency. Department treatment: IMToradol, and left ankle ASO splint     Labs Reviewed - No data to display No results found.   1. Ankle pain, left       MDM  Subacute left ankle swelling and pain. Status postoperative repair of fracture. 1 year ago. Has had bilateral lower extremity edema. Doubt DVT, left, occult fracture, cellulitis  Plan: Home Medications- increase Gabapentin to 600mg  TID and use Advil for pain; Home Treatments- Splint prn; Recommended follow up- PCP or Ortho prn        Flint Melter,  MD 11/20/11 2354

## 2011-11-20 NOTE — ED Notes (Signed)
Pt states he is on his feet all day walking on concrete and thinks this is why is his L ankle is hurt. Pt states he does not want any narcotics for pain.

## 2011-11-21 MED ORDER — GABAPENTIN 600 MG PO TABS: 600.0000 mg | ORAL_TABLET | Freq: Three times a day (TID) | ORAL | Status: DC

## 2011-12-06 ENCOUNTER — Emergency Department (HOSPITAL_BASED_OUTPATIENT_CLINIC_OR_DEPARTMENT_OTHER)
Admission: EM | Admit: 2011-12-06 | Discharge: 2011-12-06 | Disposition: A | Discharge status: Home / Self Care | Payer: Self-pay | Attending: Emergency Medicine | Admitting: Emergency Medicine

## 2011-12-06 ENCOUNTER — Encounter (HOSPITAL_BASED_OUTPATIENT_CLINIC_OR_DEPARTMENT_OTHER): Payer: Self-pay | Admitting: Family Medicine

## 2011-12-06 ENCOUNTER — Emergency Department (INDEPENDENT_AMBULATORY_CARE_PROVIDER_SITE_OTHER): Payer: Self-pay

## 2011-12-06 DIAGNOSIS — M7989 Other specified soft tissue disorders: Secondary | ICD-10-CM

## 2011-12-06 DIAGNOSIS — L03116 Cellulitis of left lower limb: Secondary | ICD-10-CM

## 2011-12-06 DIAGNOSIS — F341 Dysthymic disorder: Secondary | ICD-10-CM | POA: Insufficient documentation

## 2011-12-06 DIAGNOSIS — L02419 Cutaneous abscess of limb, unspecified: Secondary | ICD-10-CM | POA: Insufficient documentation

## 2011-12-06 DIAGNOSIS — F17200 Nicotine dependence, unspecified, uncomplicated: Secondary | ICD-10-CM | POA: Insufficient documentation

## 2011-12-06 MED ORDER — SULFAMETHOXAZOLE-TMP DS 800-160 MG PO TABS
1.0000 | ORAL_TABLET | Freq: Once | ORAL | Status: AC
  Administered 2011-12-06: 1 via ORAL
  Filled 2011-12-06: qty 1

## 2011-12-06 MED ORDER — IBUPROFEN 800 MG PO TABS
800.0000 mg | ORAL_TABLET | Freq: Once | ORAL | Status: AC
  Administered 2011-12-06: 800 mg via ORAL
  Filled 2011-12-06: qty 1

## 2011-12-06 MED ORDER — IBUPROFEN 600 MG PO TABS: 600.0000 mg | ORAL_TABLET | Freq: Three times a day (TID) | ORAL | Status: AC | PRN

## 2011-12-06 MED ORDER — SULFAMETHOXAZOLE-TRIMETHOPRIM 800-160 MG PO TABS: 1.0000 | ORAL_TABLET | Freq: Two times a day (BID) | ORAL | Status: AC

## 2011-12-06 NOTE — ED Provider Notes (Signed)
History     CSN: [id number]  Arrival date & time 12/06/11  1012   First MD Initiated Contact with Patient 12/06/11 1017      Chief Complaint  Patient presents with  . Leg Swelling    (Consider location/radiation/quality/duration/timing/severity/associated sxs/prior treatment) The history is provided by the patient.   patient reports approximately 1-2 weeks of left lower extremity swelling that is localized around his left ankle his left lower tibia.  He has no swelling proximal to this.  He denies fevers.  He reports occasional chills.  He reports the swelling is worse in the evening and better in the morning time.  He denies recent surgery or long travel.  He has no prior history of DVT or pulmonary embolism.  Approximately 1618 months ago he did have surgery on his left ankle for associated fracture and has hardware in place.  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  He requests that I asked him to stop this as he is at [facility] and is unable to stop medications on his own without physician input.  He denies developing a rash or redness.  He denies weakness or numbness in his left lower extremity.  Past Medical History  Diagnosis Date  . Renal disorder   . Kidney stones   . Anxiety   . Depression   . Opiate withdrawal 11/09/2011    Past Surgical History  Procedure Date  . L ankle surgery     Plates and screws placed last October at [facility]  . Reconstructive surgery r great toe     Family History  Problem Relation Age of Onset  . Heart failure Mother   . Anesthesia problems Neg Hx   . Hypotension Neg Hx   . Malignant hyperthermia Neg Hx   . Pseudochol deficiency Neg Hx     History  Substance Use Topics  . Smoking status: Current Everyday Smoker -- 0.2 packs/day for 15 years    Types: Cigarettes    . Smokeless tobacco: Current User    Types: Snuff  . Alcohol Use: No      Review of Systems  All other systems reviewed and are negative.    Allergies  Review of patient's allergies indicates no known allergies.  Home Medications   Current Outpatient Rx  Name Route Sig Dispense Refill  . TRAZODONE HCL 50 MG PO TABS Oral Take 100 mg by mouth at bedtime. For sleep    . GABAPENTIN 600 MG PO TABS Oral Take 1 tablet (600 mg total) by mouth 3 (three) times daily. 21 tablet 0  . HYDROXYZINE PAMOATE 50 MG PO CAPS Oral Take 75 mg by mouth 3 (three) times daily as needed.    . IBUPROFEN 600 MG PO TABS Oral Take 1 tablet (600 mg total) by mouth every 8 (eight) hours as needed for pain. 15 tablet 0  . IBUPROFEN 800 MG PO TABS Oral Take 800 mg by mouth every 8 (eight) hours as needed.    . METHOCARBAMOL 750 MG PO TABS Oral Take 1,500 mg by mouth 4 (four) times daily. As needed    . ONE-DAILY MULTI VITAMINS PO TABS Oral Take 1 tablet by mouth daily.    . SULFAMETHOXAZOLE-TRIMETHOPRIM 800-160 MG PO TABS Oral Take 1 tablet by mouth every 12 (twelve) hours. 14 tablet  0    BP 159/91  Pulse 85  Temp(Src) 97.7 F (36.5 C) (Oral)  Resp 14  Ht 5\' 9"  (1.753 m)  Wt 240 lb (108.863 kg)  BMI 35.44 kg/m2  SpO2 100%  Physical Exam  Nursing note and vitals reviewed. Constitutional: He is oriented to person, place, and time. He appears well-developed and well-nourished.  HENT:  Head: Normocephalic and atraumatic.  Eyes: EOM are normal.  Neck: Normal range of motion.  Cardiovascular: Normal rate, regular rhythm, normal heart sounds and intact distal pulses.   Pulmonary/Chest: Effort normal and breath sounds normal. No respiratory distress.  Abdominal: Soft. He exhibits no distension. There is no tenderness.  Musculoskeletal: Normal range of motion.       Mild swelling of his left lower extremity from the level of his left ankle to his distal one third of his left tibia.  There is mild swelling  and warmth.  There is no frank erythema.  There is no crepitus.  There is no obvious abrasion or ulceration to this area.  He has normal PT and DP pulses in his left foot  Neurological: He is alert and oriented to person, place, and time.  Skin: Skin is warm and dry.  Psychiatric: He has a normal mood and affect. Judgment normal.    ED Course  Procedures (including critical care time)  Labs Reviewed - No data to display Dg Ankle Complete Left  12/06/2011  *RADIOLOGY REPORT*  Clinical Data: Swelling of the lower leg, history of surgery 14 months ago  LEFT ANKLE COMPLETE - 3+ VIEW  Comparison: Left ankle films of 10/25/2011  Findings: Fixation plate and screw across the distal left fibula and two screws for fixation of the medial malleolus appear unchanged in position with no acute fracture.  The ankle joint appears normal.  Alignment is normal.  IMPRESSION: Stable prior fixation.  No acute abnormality.  Original Report Authenticated By: Juline Patch, M.D.     1. Cellulitis of left ankle       MDM  Is unclear if this is simple venous insufficiency or whether or not he is developing an early cellulitis.  However on the side of treating her for possible cellulitis to see if this helps.  He's also been instructed to elevate his leg as well is to try compression stockings.  My suspicion that this is DVT is very low.  He has no swelling of his thigh or proximal left lower extremity.  He is neurovascularly intact.  He has been seen for these complaints before.  Plain films without evidence of air in the soft tissues or abnormalities of his hardware        Lyanne Co, MD 12/06/11 1435

## 2011-12-06 NOTE — ED Notes (Signed)
Pt c/o left lower ankle and leg swelling x 1 week. Pt reports swelling worse at end of day and improved in the morning. Pt denies recent travel.  Pt denies shob.

## 2011-12-06 NOTE — Discharge Instructions (Signed)

## 2012-07-15 IMAGING — CT CT ABD-PELV W/O CM
2 of 4 series · 13 of 32 positions shown, 18 images · non-contrast
Comparison: 06/30/2011

CLINICAL DATA: Right flank pain, nausea, vomiting, fever

CT ABDOMEN AND PELVIS WITHOUT CONTRAST
TECHNIQUE: Multidetector CT imaging of the abdomen and pelvis was
performed following the standard protocol without intravenous
contrast.

[Series 2: renal stone · axial · 0.80mm/px · z∈[-405,-80]mm · 5 of 94 slices shown, 10 images]
[im 16/94  soft-tissue]
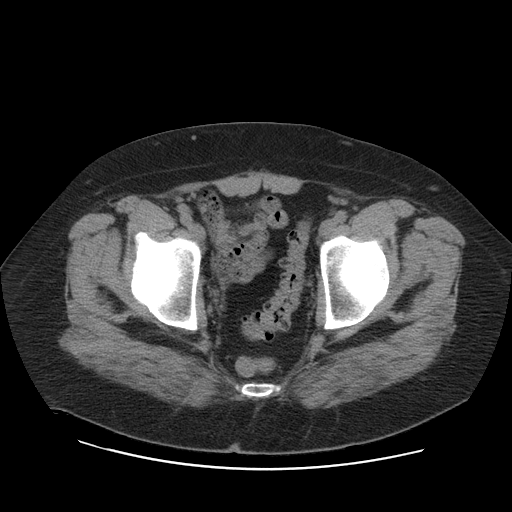
[im 16/94  bone]
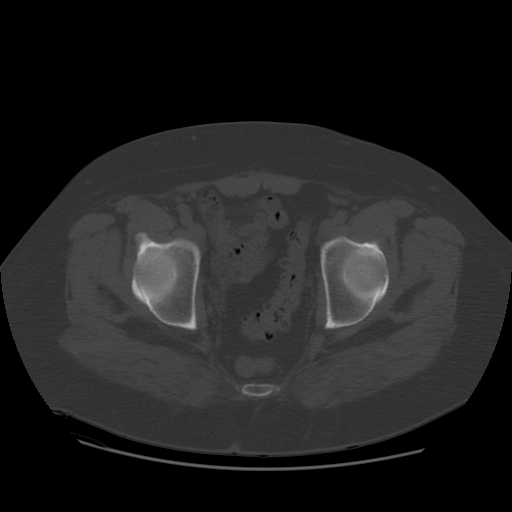
[im 32/94  soft-tissue]
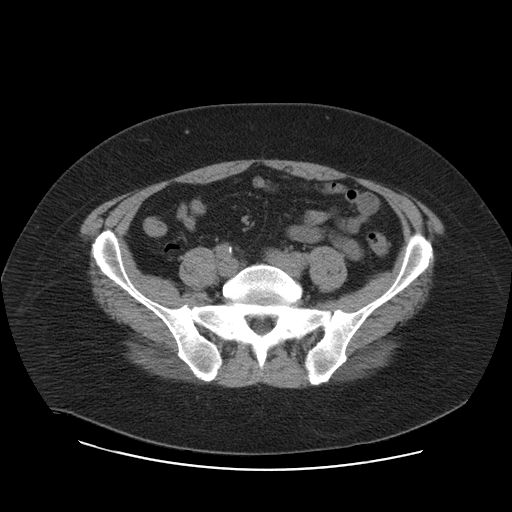
[im 32/94  lung]
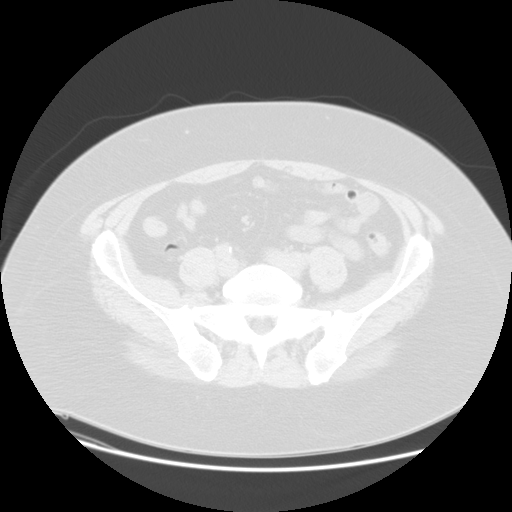
[im 47/94  soft-tissue]
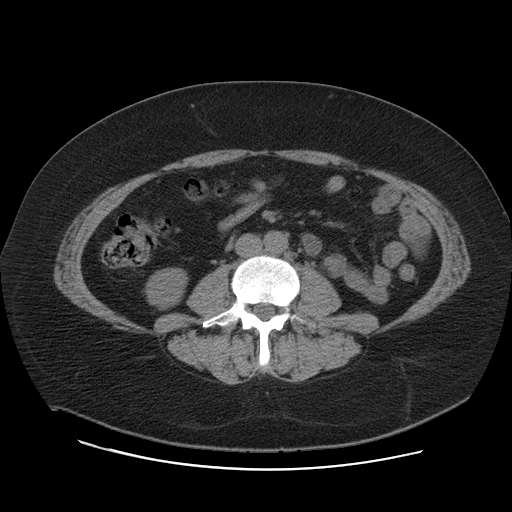
[im 47/94  lung]
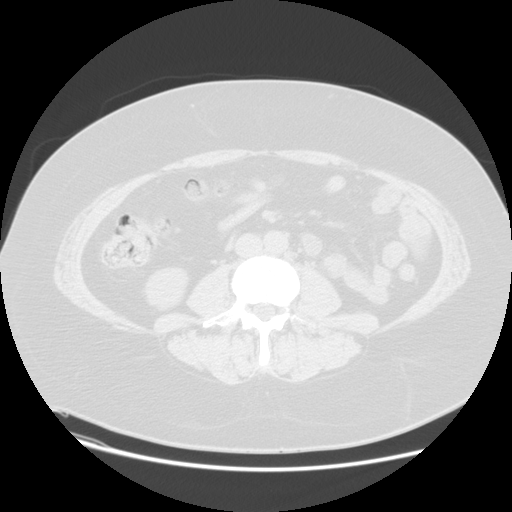
[im 63/94  soft-tissue]
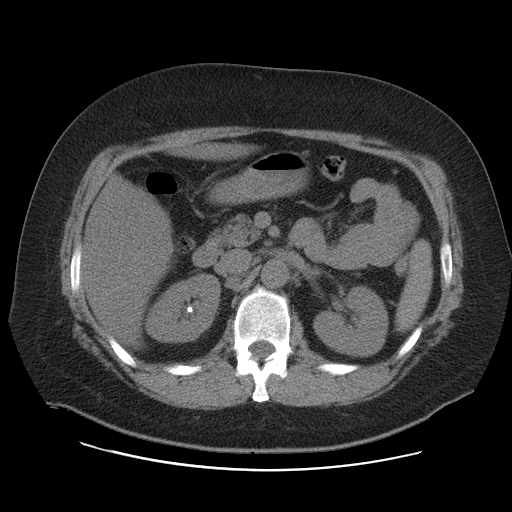
[im 63/94  lung]
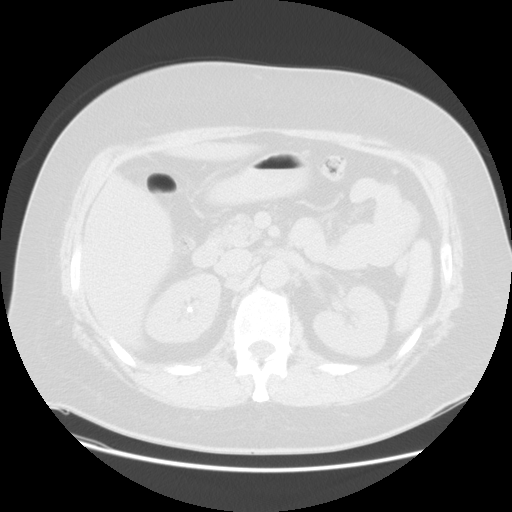
[im 78/94  soft-tissue]
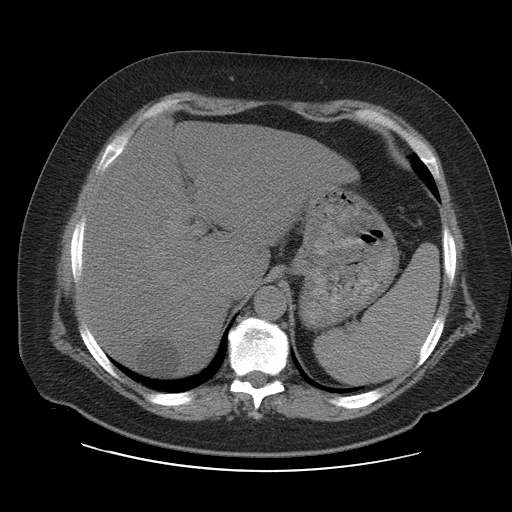
[im 78/94  lung]
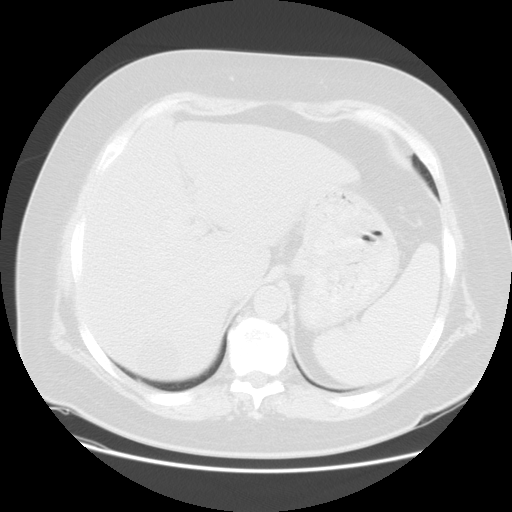

[Series 401: sag · sagittal · 1.00mm/px · 8 of 203 slices shown]
[im 15/203  soft-tissue]
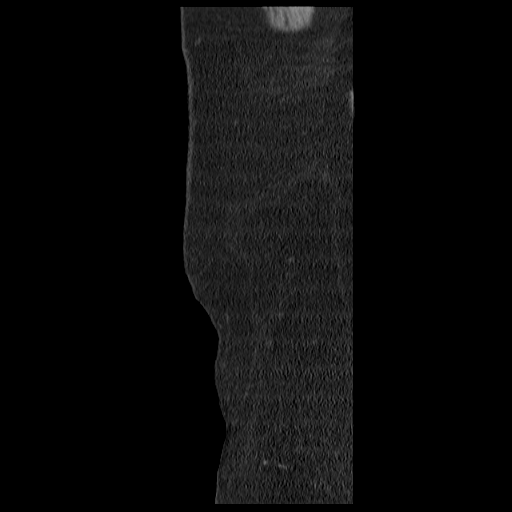
[im 44/203  soft-tissue]
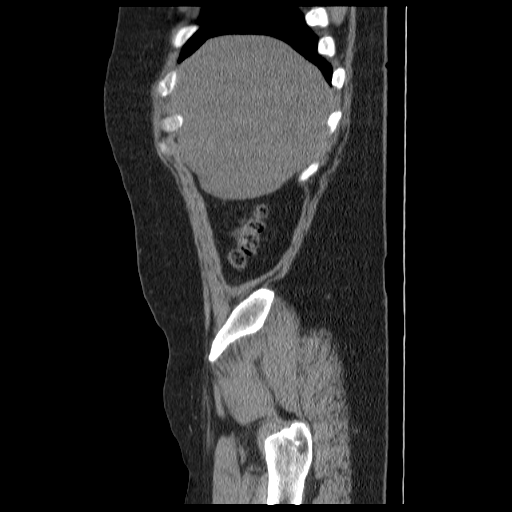
[im 73/203  soft-tissue]
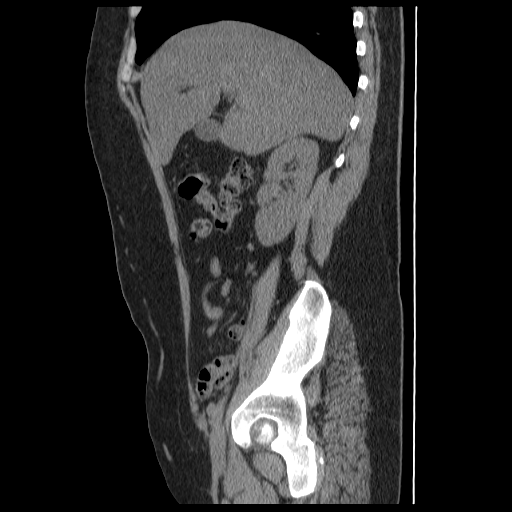
[im 87/203  soft-tissue]
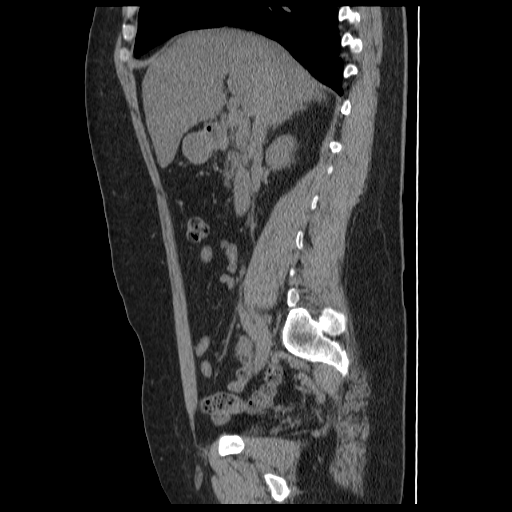
[im 116/203  soft-tissue]
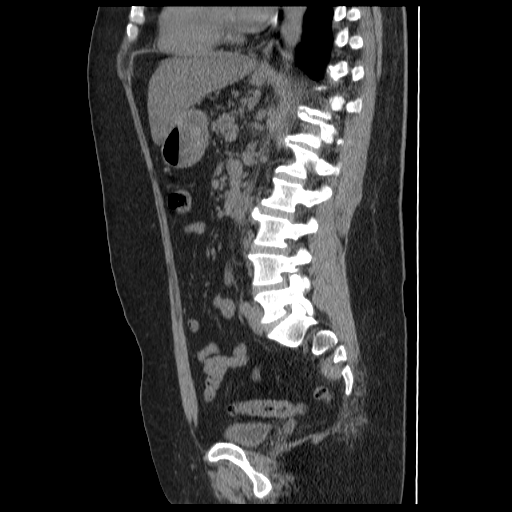
[im 130/203  soft-tissue]
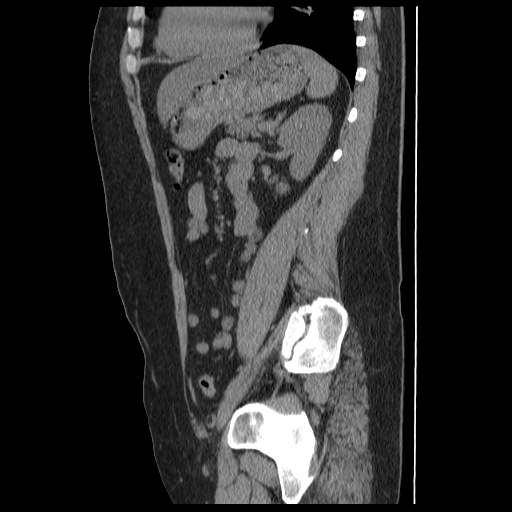
[im 159/203  soft-tissue]
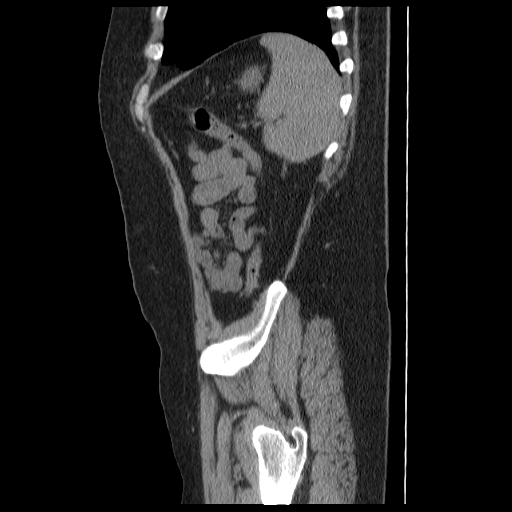
[im 188/203  soft-tissue]
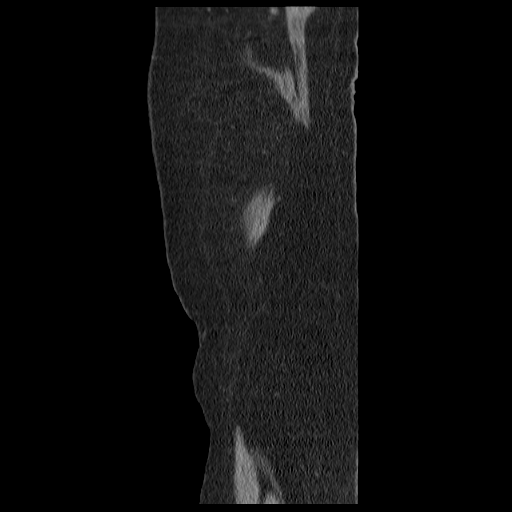

[13 of 32 positions shown; findings below may reference images not displayed]

FINDINGS: The lung bases are clear.  There is a 4 mm stone in the
upper pole of the right kidney and 6 mm stone in the lower pole of
the right kidney.  Additional tiny punctate stones are demonstrated
in both the right and the left kidney.  These appear stable since
the previous study.  No pyelocaliectasis or ureterectasis.  No
ureteral stones.  The bladder wall is not thickened and no bladder
stones are visualized.

Stable circumscribed low attenuation lesion in the posterior
segment right lobe of liver.  This is likely a cyst or hemangioma.
Diffuse enlargement the liver with low attenuation change
consistent with diffuse fatty infiltration.  The gallbladder,
spleen, pancreas, adrenal glands, and abdominal aorta are
unremarkable.  Small accessory spleen.  Retroperitoneal lymph nodes
are not pathologically enlarged and appears stable.  The stomach
and small bowel are decompressed.  No free air or free fluid in the
abdomen.

Pelvis:  No free or loculated pelvic fluid collections.  The
prostate gland is not enlarged.  Scattered diverticula in the
sigmoid colon without inflammatory change.  The colon is
decompressed.  The appendix is normal.  Degenerative changes in the
lumbar spine with normal alignment.
IMPRESSION: Stable appearance of the study since previous study.  Bilateral
intrarenal stones without ureteral obstruction.  Diffuse fatty
infiltration of the liver.  Stable hepatic cyst or hemangioma.  No
acute inflammatory changes demonstrated.

## 2014-08-15 ENCOUNTER — Encounter (HOSPITAL_COMMUNITY): Payer: Self-pay | Admitting: *Deleted

## 2014-08-15 ENCOUNTER — Emergency Department (HOSPITAL_COMMUNITY): Payer: Self-pay

## 2014-08-15 ENCOUNTER — Emergency Department (HOSPITAL_COMMUNITY)
Admission: EM | Admit: 2014-08-15 | Discharge: 2014-08-15 | Disposition: A | Discharge status: Home / Self Care | Payer: Self-pay | Attending: Emergency Medicine | Admitting: Emergency Medicine

## 2014-08-15 DIAGNOSIS — F419 Anxiety disorder, unspecified: Secondary | ICD-10-CM | POA: Insufficient documentation

## 2014-08-15 DIAGNOSIS — Z791 Long term (current) use of non-steroidal anti-inflammatories (NSAID): Secondary | ICD-10-CM | POA: Insufficient documentation

## 2014-08-15 DIAGNOSIS — R109 Unspecified abdominal pain: Secondary | ICD-10-CM | POA: Insufficient documentation

## 2014-08-15 DIAGNOSIS — Z720 Tobacco use: Secondary | ICD-10-CM | POA: Insufficient documentation

## 2014-08-15 DIAGNOSIS — F329 Major depressive disorder, single episode, unspecified: Secondary | ICD-10-CM | POA: Insufficient documentation

## 2014-08-15 DIAGNOSIS — Z87442 Personal history of urinary calculi: Secondary | ICD-10-CM | POA: Insufficient documentation

## 2014-08-15 DIAGNOSIS — Z87448 Personal history of other diseases of urinary system: Secondary | ICD-10-CM | POA: Insufficient documentation

## 2014-08-15 LAB — URINALYSIS, ROUTINE W REFLEX MICROSCOPIC
BILIRUBIN URINE: NEGATIVE
Glucose, UA: NEGATIVE mg/dL
Hgb urine dipstick: NEGATIVE
Ketones, ur: NEGATIVE mg/dL
LEUKOCYTES UA: NEGATIVE
NITRITE: NEGATIVE
PH: 5.5 (ref 5.0–8.0)
Protein, ur: NEGATIVE mg/dL
Urobilinogen, UA: 0.2 mg/dL (ref 0.0–1.0)

## 2014-08-15 LAB — CBC WITH DIFFERENTIAL/PLATELET
BASOS PCT: 0 % (ref 0–1)
Basophils Absolute: 0 10*3/uL (ref 0.0–0.1)
Eosinophils Absolute: 0.1 10*3/uL (ref 0.0–0.7)
Eosinophils Relative: 1 % (ref 0–5)
HCT: 43.3 % (ref 39.0–52.0)
HEMOGLOBIN: 14.7 g/dL (ref 13.0–17.0)
LYMPHS ABS: 2 10*3/uL (ref 0.7–4.0)
Lymphocytes Relative: 23 % (ref 12–46)
MCH: 29.5 pg (ref 26.0–34.0)
MCHC: 33.9 g/dL (ref 30.0–36.0)
MCV: 86.8 fL (ref 78.0–100.0)
MONOS PCT: 5 % (ref 3–12)
Monocytes Absolute: 0.4 10*3/uL (ref 0.1–1.0)
NEUTROS ABS: 6 10*3/uL (ref 1.7–7.7)
NEUTROS PCT: 71 % (ref 43–77)
Platelets: 197 10*3/uL (ref 150–400)
RBC: 4.99 MIL/uL (ref 4.22–5.81)
RDW: 14.3 % (ref 11.5–15.5)
WBC: 8.6 10*3/uL (ref 4.0–10.5)

## 2014-08-15 LAB — BASIC METABOLIC PANEL
ANION GAP: 15 (ref 5–15)
BUN: 12 mg/dL (ref 6–23)
CHLORIDE: 100 meq/L (ref 96–112)
CO2: 24 mEq/L (ref 19–32)
Calcium: 9.7 mg/dL (ref 8.4–10.5)
Creatinine, Ser: 0.66 mg/dL (ref 0.50–1.35)
GFR calc Af Amer: >90 mL/min (ref >90)
GFR calc non Af Amer: >90 mL/min (ref >90)
Glucose, Bld: 169 mg/dL — ABNORMAL HIGH (ref 70–99)
POTASSIUM: 4.1 meq/L (ref 3.7–5.3)
SODIUM: 139 meq/L (ref 137–147)

## 2014-08-15 MED ORDER — SODIUM CHLORIDE 0.9 % IV BOLUS (SEPSIS)
1000.0000 mL | Freq: Once | INTRAVENOUS | Status: AC
  Administered 2014-08-15: 1000 mL via INTRAVENOUS

## 2014-08-15 MED ORDER — HYDROCODONE-ACETAMINOPHEN 5-325 MG PO TABS: 1.0000 | ORAL_TABLET | Freq: Four times a day (QID) | ORAL | Status: DC | PRN

## 2014-08-15 MED ORDER — HYDROMORPHONE HCL 1 MG/ML IJ SOLN
1.0000 mg | Freq: Once | INTRAMUSCULAR | Status: AC
Start: 2014-08-15 — End: 2014-08-15
  Administered 2014-08-15: 1 mg via INTRAVENOUS
  Filled 2014-08-15: qty 1

## 2014-08-15 MED ORDER — NAPROXEN 500 MG PO TABS: 500.0000 mg | ORAL_TABLET | Freq: Two times a day (BID) | ORAL | Status: DC

## 2014-08-15 MED ORDER — SODIUM CHLORIDE 0.9 % IV SOLN: INTRAVENOUS | Status: DC

## 2014-08-15 MED ORDER — HYDROMORPHONE HCL 1 MG/ML IJ SOLN
INTRAMUSCULAR | Status: AC
  Administered 2014-08-15: 1 mg
  Filled 2014-08-15: qty 1

## 2014-08-15 MED ORDER — ONDANSETRON HCL 4 MG/2ML IJ SOLN
4.0000 mg | Freq: Once | INTRAMUSCULAR | Status: AC
  Administered 2014-08-15: 4 mg via INTRAVENOUS
  Filled 2014-08-15: qty 2

## 2014-08-15 NOTE — ED Notes (Signed)
Pt complaining of nausea.   

## 2014-08-15 NOTE — Discharge Instructions (Signed)
Flank Pain Flank pain is pain in your side. The flank is the area of your side between your upper belly (abdomen) and your back. Pain in this area can be caused by many different things. HOME CARE Home care and treatment will depend on the cause of your pain.  Rest as told by your doctor.  Drink enough fluids to keep your pee (urine) clear or pale yellow.  Only take medicine as told by your doctor.  Tell your doctor about any changes in your pain.  Follow up with your doctor. GET HELP RIGHT AWAY IF:   Your pain does not get better with medicine.   You have new symptoms or your symptoms get worse.  Your pain gets worse.   You have belly (abdominal) pain.   You are short of breath.   You always feel sick to your stomach (nauseous).   You keep throwing up (vomiting).   You have puffiness (swelling) in your belly.   You feel light-headed or you pass out (faint).   You have blood in your pee.  You have a fever or lasting symptoms for more than 2-3 days.  You have a fever and your symptoms suddenly get worse. MAKE SURE YOU:   Understand these instructions.  Will watch your condition.  Will get help right away if you are not doing well or get worse. Document Released: 06/11/2008 Document Revised: 01/17/2014 Document Reviewed: 04/16/2012 [facility] Patient Information 2015 [employer] [place]. This information is not intended to replace advice given to you by your health care provider. Make sure you discuss any questions you have with your health care provider.  Take medications as directed. Return for any new or worse symptoms. Follow-up with your doctors. No evidence of ureteral stone today.

## 2014-08-15 NOTE — ED Notes (Signed)
Pt states he started having right flank pain that moves into his groin beginning this morning. Pt states he has hx of kidney stones and had a lithotripsy done last year. Pt states he had a small amount of blood in his urine this morning.

## 2014-08-15 NOTE — ED Provider Notes (Signed)
CSN: [id number]     Arrival date & time 08/15/14  1417 History   First MD Initiated Contact with Patient 08/15/14 1700     Chief Complaint  Patient presents with  . Flank Pain     (Consider location/radiation/quality/duration/timing/severity/associated sxs/prior Treatment) Patient is a 58 y.o. male presenting with flank pain. The history is provided by the patient.  Flank Pain Associated symptoms include abdominal pain. Pertinent negatives include no chest pain, no headaches and no shortness of breath.   patient with onset of right flank pain radiating to the right groin area into the testicle started around midnight, worse this morning. Associated with some blood in the urine patient with history of kidney stones in the past. Reminds him of that. Associated with nausea no vomiting. Pain is 10 out of 10 colicky in nature. Not made worse or better by anything. Past Medical History  Diagnosis Date  . Renal disorder   . Kidney stones   . Anxiety   . Depression   . Opiate withdrawal 11/09/2011   Past Surgical History  Procedure Laterality Date  . L ankle surgery      [facility]  . Reconstructive surgery r great toe     Family History  Problem Relation Age of Onset  . Heart failure Mother   . Anesthesia problems Neg Hx   . Hypotension Neg Hx   . Malignant hyperthermia Neg Hx   . Pseudochol deficiency Neg Hx    History  Substance Use Topics  . Smoking status: Current Every Day Smoker -- 0.25 packs/day for 15 years    Types: Cigarettes  . Smokeless tobacco: Current User    Types: Snuff  . Alcohol Use: No    Review of Systems  Constitutional: Negative for fever.  HENT: Negative for congestion.   Eyes: Negative for visual disturbance.  Respiratory: Negative for shortness of breath.   Cardiovascular: Negative for chest pain.  Gastrointestinal: Positive for nausea and abdominal pain. Negative for vomiting.  Genitourinary: Positive for  hematuria, flank pain and testicular pain.  Musculoskeletal: Negative for back pain.  Skin: Negative for rash.  Neurological: Negative for headaches.  Hematological: Does not bruise/bleed easily.  Psychiatric/Behavioral: Negative for confusion.      Allergies  Review of patient's allergies indicates no known allergies.  Home Medications   Prior to Admission medications   Medication Sig Start Date End Date Taking? Authorizing Provider  ibuprofen (ADVIL,MOTRIN) 200 MG tablet Take 800 mg by mouth every 6 (six) hours as needed for mild pain or moderate pain.   Yes Historical Provider, MD  HYDROcodone-acetaminophen (NORCO/VICODIN) 5-325 MG per tablet Take 1-2 tablets by mouth every 6 (six) hours as needed. 08/15/14   Vanetta MuldersScott Treazure Nery, MD  naproxen (NAPROSYN) 500 MG tablet Take 1 tablet (500 mg total) by mouth 2 (two) times daily. 08/15/14   Vanetta MuldersScott Lucus Lambertson, MD   BP 151/88 mmHg  Pulse 75  Temp(Src) 97.8 F (36.6 C) (Oral)  Resp 20  Ht 5\' 9"  (1.753 m)  Wt 260 lb (117.935 kg)  BMI 38.38 kg/m2  SpO2 97% Physical Exam  Constitutional: He is oriented to person, place, and time. He appears distressed.  HENT:  Head: Normocephalic and atraumatic.  Mouth/Throat: Oropharynx is clear and moist.  Eyes: Conjunctivae are normal. Pupils are equal, round, and reactive to light.  Neck: Normal range of motion.  Cardiovascular: Normal rate, regular rhythm and normal heart sounds.   No murmur heard. Pulmonary/Chest: Effort  normal and breath sounds normal. No respiratory distress.  Abdominal: Soft. Bowel sounds are normal. There is no tenderness.  Musculoskeletal: Normal range of motion.  Neurological: He is alert and oriented to person, place, and time. No cranial nerve deficit. He exhibits normal muscle tone. Coordination normal.  Skin: Skin is warm. No rash noted.  Nursing note and vitals reviewed.   ED Course  Procedures (including critical care time) Labs Review Labs Reviewed  URINALYSIS,  ROUTINE W REFLEX MICROSCOPIC - Abnormal; Notable for the following:    Specific Gravity, Urine <1.005 (*)    All other components within normal limits  BASIC METABOLIC PANEL - Abnormal; Notable for the following:    Glucose, Bld 169 (*)    All other components within normal limits  CBC WITH DIFFERENTIAL   Results for orders placed or performed during the hospital encounter of 08/15/14  Urinalysis, Routine w reflex microscopic  Result Value Ref Range   Color, Urine YELLOW YELLOW   APPearance CLEAR CLEAR   Specific Gravity, Urine <1.005 (L) 1.005 - 1.030   pH 5.5 5.0 - 8.0   Glucose, UA NEGATIVE NEGATIVE mg/dL   Hgb urine dipstick NEGATIVE NEGATIVE   Bilirubin Urine NEGATIVE NEGATIVE   Ketones, ur NEGATIVE NEGATIVE mg/dL   Protein, ur NEGATIVE NEGATIVE mg/dL   Urobilinogen, UA 0.2 0.0 - 1.0 mg/dL   Nitrite NEGATIVE NEGATIVE   Leukocytes, UA NEGATIVE NEGATIVE  Basic metabolic panel  Result Value Ref Range   Sodium 139 137 - 147 mEq/L   Potassium 4.1 3.7 - 5.3 mEq/L   Chloride 100 96 - 112 mEq/L   CO2 24 19 - 32 mEq/L   Glucose, Bld 169 (H) 70 - 99 mg/dL   BUN 12 6 - 23 mg/dL   Creatinine, Ser 6.57 0.50 - 1.35 mg/dL   Calcium 9.7 8.4 - 84.6 mg/dL   GFR calc non Af Amer >90 >90 mL/min   GFR calc Af Amer >90 >90 mL/min   Anion gap 15 5 - 15  CBC with Differential  Result Value Ref Range   WBC 8.6 4.0 - 10.5 K/uL   RBC 4.99 4.22 - 5.81 MIL/uL   Hemoglobin 14.7 13.0 - 17.0 g/dL   HCT 96.2 95.2 - 84.1 %   MCV 86.8 78.0 - 100.0 fL   MCH 29.5 26.0 - 34.0 pg   MCHC 33.9 30.0 - 36.0 g/dL   RDW 32.4 40.1 - 02.7 %   Platelets 197 150 - 400 K/uL   Neutrophils Relative % 71 43 - 77 %   Neutro Abs 6.0 1.7 - 7.7 K/uL   Lymphocytes Relative 23 12 - 46 %   Lymphs Abs 2.0 0.7 - 4.0 K/uL   Monocytes Relative 5 3 - 12 %   Monocytes Absolute 0.4 0.1 - 1.0 K/uL   Eosinophils Relative 1 0 - 5 %   Eosinophils Absolute 0.1 0.0 - 0.7 K/uL   Basophils Relative 0 0 - 1 %   Basophils Absolute  0.0 0.0 - 0.1 K/uL     Imaging Review Ct Renal Stone Study  08/15/2014   CLINICAL DATA:  Right flank pain. History of renal calculi with recent urologic procedure  EXAM: CT ABDOMEN AND PELVIS WITHOUT CONTRAST  TECHNIQUE: Multidetector CT imaging of the abdomen and pelvis was performed following the standard protocol without IV contrast.  COMPARISON:  10/01/2011  FINDINGS: Small nonobstructing calculus in the upper pole of the right kidney measures 3 mm. No hydronephrosis bilaterally. No evidence of  ureteral or bladder calculi.  Posterior right lobe hepatic cyst shows slight interval enlargement since the prior CT, now measuring 3.8 x 4.6 cm. No other hepatic cysts identified. The gallbladder, spleen, pancreas, adrenal glands and bowel are unremarkable. No abnormal fluid collections. No masses or enlarged lymph nodes are seen. No hernias are identified.  Bony structures show mild degenerative changes in the lower thoracic and upper lumbar spines. Visualized lung bases are unremarkable.  IMPRESSION: 1. 3 mm nonobstructing calculus in the upper pole of the right kidney. No hydronephrosis, ureteral or bladder calculi. 2. Slight interval enlargement of a right hepatic cyst.   Electronically Signed   By: Irish LackGlenn  Yamagata M.D.   On: 08/15/2014 18:15     EKG Interpretation None      MDM   Final diagnoses:  Flank pain    Patient symptoms consistent with the ureteral stone however workup negative. No evidence of urinary tract infection or hematuria. No renal function abnormalities. No groin abnormalities. Will treat with anti-inflammatory and pain medicine, follow-up with his record Dr. Patient improved here with pain medication. Past history of opioid problems noted but that was in 2013. Has not been any issues recently.    Vanetta MuldersScott Taiwan Millon, MD 08/15/14 1840

## 2014-08-18 ENCOUNTER — Emergency Department (HOSPITAL_COMMUNITY)
Admission: EM | Admit: 2014-08-18 | Discharge: 2014-08-18 | Disposition: A | Discharge status: Other Acute Inpatient Hospital | Payer: Self-pay | Attending: Emergency Medicine | Admitting: Emergency Medicine

## 2014-08-18 ENCOUNTER — Encounter (HOSPITAL_COMMUNITY): Payer: Self-pay | Admitting: Emergency Medicine

## 2014-08-18 ENCOUNTER — Encounter (HOSPITAL_COMMUNITY): Payer: Self-pay | Admitting: *Deleted

## 2014-08-18 ENCOUNTER — Inpatient Hospital Stay (HOSPITAL_COMMUNITY)
Admission: AD | Admit: 2014-08-18 | Discharge: 2014-08-24 | DRG: 897 | Disposition: A | Discharge status: Home / Self Care | Payer: Federal, State, Local not specified - Other | Source: Intra-hospital | Attending: Psychiatry | Admitting: Psychiatry

## 2014-08-18 DIAGNOSIS — F10932 Alcohol use, unspecified with withdrawal with perceptual disturbance: Secondary | ICD-10-CM

## 2014-08-18 DIAGNOSIS — F1994 Other psychoactive substance use, unspecified with psychoactive substance-induced mood disorder: Secondary | ICD-10-CM

## 2014-08-18 DIAGNOSIS — F332 Major depressive disorder, recurrent severe without psychotic features: Secondary | ICD-10-CM | POA: Diagnosis present

## 2014-08-18 DIAGNOSIS — R4589 Other symptoms and signs involving emotional state: Secondary | ICD-10-CM

## 2014-08-18 DIAGNOSIS — F329 Major depressive disorder, single episode, unspecified: Secondary | ICD-10-CM | POA: Diagnosis present

## 2014-08-18 DIAGNOSIS — G4700 Insomnia, unspecified: Secondary | ICD-10-CM | POA: Diagnosis present

## 2014-08-18 DIAGNOSIS — F17210 Nicotine dependence, cigarettes, uncomplicated: Secondary | ICD-10-CM | POA: Diagnosis present

## 2014-08-18 DIAGNOSIS — F1010 Alcohol abuse, uncomplicated: Secondary | ICD-10-CM | POA: Insufficient documentation

## 2014-08-18 DIAGNOSIS — Z8249 Family history of ischemic heart disease and other diseases of the circulatory system: Secondary | ICD-10-CM

## 2014-08-18 DIAGNOSIS — F32A Depression, unspecified: Secondary | ICD-10-CM | POA: Diagnosis present

## 2014-08-18 DIAGNOSIS — F19239 Other psychoactive substance dependence with withdrawal, unspecified: Secondary | ICD-10-CM | POA: Insufficient documentation

## 2014-08-18 DIAGNOSIS — Z609 Problem related to social environment, unspecified: Secondary | ICD-10-CM | POA: Diagnosis present

## 2014-08-18 DIAGNOSIS — F1110 Opioid abuse, uncomplicated: Secondary | ICD-10-CM

## 2014-08-18 DIAGNOSIS — F19951 Other psychoactive substance use, unspecified with psychoactive substance-induced psychotic disorder with hallucinations: Secondary | ICD-10-CM | POA: Insufficient documentation

## 2014-08-18 DIAGNOSIS — Z720 Tobacco use: Secondary | ICD-10-CM | POA: Insufficient documentation

## 2014-08-18 DIAGNOSIS — R45851 Suicidal ideations: Secondary | ICD-10-CM | POA: Diagnosis present

## 2014-08-18 DIAGNOSIS — F1123 Opioid dependence with withdrawal: Secondary | ICD-10-CM | POA: Diagnosis present

## 2014-08-18 DIAGNOSIS — F1193 Opioid use, unspecified with withdrawal: Secondary | ICD-10-CM | POA: Diagnosis present

## 2014-08-18 DIAGNOSIS — Z791 Long term (current) use of non-steroidal anti-inflammatories (NSAID): Secondary | ICD-10-CM | POA: Insufficient documentation

## 2014-08-18 DIAGNOSIS — Z87442 Personal history of urinary calculi: Secondary | ICD-10-CM | POA: Insufficient documentation

## 2014-08-18 DIAGNOSIS — F3289 Other specified depressive episodes: Secondary | ICD-10-CM

## 2014-08-18 DIAGNOSIS — Y900 Blood alcohol level of less than 20 mg/100 ml: Secondary | ICD-10-CM | POA: Diagnosis present

## 2014-08-18 DIAGNOSIS — F418 Other specified anxiety disorders: Secondary | ICD-10-CM | POA: Diagnosis present

## 2014-08-18 DIAGNOSIS — F10232 Alcohol dependence with withdrawal with perceptual disturbance: Principal | ICD-10-CM | POA: Diagnosis present

## 2014-08-18 DIAGNOSIS — F1120 Opioid dependence, uncomplicated: Secondary | ICD-10-CM

## 2014-08-18 DIAGNOSIS — F1924 Other psychoactive substance dependence with psychoactive substance-induced mood disorder: Secondary | ICD-10-CM

## 2014-08-18 DIAGNOSIS — Z639 Problem related to primary support group, unspecified: Secondary | ICD-10-CM

## 2014-08-18 DIAGNOSIS — Z87448 Personal history of other diseases of urinary system: Secondary | ICD-10-CM | POA: Insufficient documentation

## 2014-08-18 DIAGNOSIS — F1020 Alcohol dependence, uncomplicated: Secondary | ICD-10-CM

## 2014-08-18 LAB — COMPREHENSIVE METABOLIC PANEL
ALBUMIN: 4.4 g/dL (ref 3.5–5.2)
ALK PHOS: 94 U/L (ref 39–117)
ALT: 54 U/L — ABNORMAL HIGH (ref 0–53)
AST: 55 U/L — AB (ref 0–37)
Anion gap: 18 — ABNORMAL HIGH (ref 5–15)
BILIRUBIN TOTAL: 1.1 mg/dL (ref 0.3–1.2)
BUN: 13 mg/dL (ref 6–23)
CHLORIDE: 99 meq/L (ref 96–112)
CO2: 22 meq/L (ref 19–32)
Calcium: 9.9 mg/dL (ref 8.4–10.5)
Creatinine, Ser: 0.68 mg/dL (ref 0.50–1.35)
GFR calc Af Amer: >90 mL/min (ref >90)
GFR calc non Af Amer: >90 mL/min (ref >90)
Glucose, Bld: 147 mg/dL — ABNORMAL HIGH (ref 70–99)
POTASSIUM: 3.8 meq/L (ref 3.7–5.3)
Sodium: 139 mEq/L (ref 137–147)
Total Protein: 7.9 g/dL (ref 6.0–8.3)

## 2014-08-18 LAB — ACETAMINOPHEN LEVEL

## 2014-08-18 LAB — CBC
HEMATOCRIT: 42.4 % (ref 39.0–52.0)
Hemoglobin: 14.3 g/dL (ref 13.0–17.0)
MCH: 29.3 pg (ref 26.0–34.0)
MCHC: 33.7 g/dL (ref 30.0–36.0)
MCV: 86.9 fL (ref 78.0–100.0)
PLATELETS: 198 10*3/uL (ref 150–400)
RBC: 4.88 MIL/uL (ref 4.22–5.81)
RDW: 14.3 % (ref 11.5–15.5)
WBC: 8.2 10*3/uL (ref 4.0–10.5)

## 2014-08-18 LAB — RAPID URINE DRUG SCREEN, HOSP PERFORMED
AMPHETAMINES: NOT DETECTED
BENZODIAZEPINES: NOT DETECTED
Barbiturates: NOT DETECTED
COCAINE: NOT DETECTED
Opiates: POSITIVE — AB
TETRAHYDROCANNABINOL: NOT DETECTED

## 2014-08-18 LAB — SALICYLATE LEVEL: Salicylate Lvl: <2 mg/dL — ABNORMAL LOW (ref 2.8–20.0)

## 2014-08-18 LAB — ETHANOL

## 2014-08-18 MED ORDER — CLONIDINE HCL 0.1 MG PO TABS
0.1000 mg | ORAL_TABLET | Freq: Every day | ORAL | Status: AC
  Administered 2014-08-23 – 2014-08-24 (×2): 0.1 mg via ORAL
  Filled 2014-08-18 (×3): qty 1

## 2014-08-18 MED ORDER — HYDROXYZINE HCL 25 MG PO TABS
25.0000 mg | ORAL_TABLET | Freq: Every evening | ORAL | Status: DC | PRN
  Administered 2014-08-18 – 2014-08-20 (×3): 25 mg via ORAL
  Filled 2014-08-18 (×3): qty 1

## 2014-08-18 MED ORDER — CLONIDINE HCL 0.1 MG PO TABS
0.1000 mg | ORAL_TABLET | Freq: Four times a day (QID) | ORAL | Status: AC
  Administered 2014-08-18 – 2014-08-20 (×8): 0.1 mg via ORAL
  Filled 2014-08-18 (×16): qty 1

## 2014-08-18 MED ORDER — LORAZEPAM 1 MG PO TABS: 1.0000 mg | ORAL_TABLET | Freq: Three times a day (TID) | ORAL | Status: DC | PRN

## 2014-08-18 MED ORDER — METHOCARBAMOL 500 MG PO TABS
500.0000 mg | ORAL_TABLET | Freq: Three times a day (TID) | ORAL | Status: AC | PRN
  Administered 2014-08-20 – 2014-08-21 (×2): 500 mg via ORAL
  Filled 2014-08-18 (×2): qty 1

## 2014-08-18 MED ORDER — MAGNESIUM HYDROXIDE 400 MG/5ML PO SUSP: 30.0000 mL | Freq: Every day | ORAL | Status: DC | PRN

## 2014-08-18 MED ORDER — LORAZEPAM 1 MG PO TABS
1.0000 mg | ORAL_TABLET | Freq: Once | ORAL | Status: AC
  Administered 2014-08-18: 1 mg via ORAL
  Filled 2014-08-18: qty 1

## 2014-08-18 MED ORDER — ONDANSETRON 4 MG PO TBDP
4.0000 mg | ORAL_TABLET | Freq: Four times a day (QID) | ORAL | Status: AC | PRN
  Administered 2014-08-18 – 2014-08-19 (×2): 4 mg via ORAL
  Filled 2014-08-18 (×2): qty 1

## 2014-08-18 MED ORDER — DICYCLOMINE HCL 20 MG PO TABS
20.0000 mg | ORAL_TABLET | Freq: Four times a day (QID) | ORAL | Status: AC | PRN
  Administered 2014-08-18 – 2014-08-21 (×5): 20 mg via ORAL
  Filled 2014-08-18 (×5): qty 1

## 2014-08-18 MED ORDER — CLONIDINE HCL 0.1 MG PO TABS
0.1000 mg | ORAL_TABLET | ORAL | Status: AC
  Administered 2014-08-20 – 2014-08-22 (×4): 0.1 mg via ORAL
  Filled 2014-08-18 (×4): qty 1

## 2014-08-18 MED ORDER — LOPERAMIDE HCL 2 MG PO CAPS
2.0000 mg | ORAL_CAPSULE | ORAL | Status: AC | PRN
  Administered 2014-08-19: 2 mg via ORAL
  Administered 2014-08-19: 4 mg via ORAL
  Filled 2014-08-18: qty 1
  Filled 2014-08-18: qty 2

## 2014-08-18 MED ORDER — ACETAMINOPHEN 325 MG PO TABS
650.0000 mg | ORAL_TABLET | Freq: Four times a day (QID) | ORAL | Status: DC | PRN
Start: 2014-08-18 — End: 2014-08-24
  Administered 2014-08-20: 650 mg via ORAL
  Filled 2014-08-18: qty 2

## 2014-08-18 MED ORDER — ALUM & MAG HYDROXIDE-SIMETH 200-200-20 MG/5ML PO SUSP: 30.0000 mL | ORAL | Status: DC | PRN

## 2014-08-18 MED ORDER — NAPROXEN 500 MG PO TABS
500.0000 mg | ORAL_TABLET | Freq: Two times a day (BID) | ORAL | Status: AC | PRN
  Administered 2014-08-18 – 2014-08-22 (×5): 500 mg via ORAL
  Filled 2014-08-18 (×5): qty 1

## 2014-08-18 NOTE — BH Assessment (Addendum)
Assessment Note  Darren Mendoza is an 58 y.o. male with hx of depression, alcoholism, and opiate withdrawal. Patient presents to [facility] seeking substance abuse help. Sts that he drinks heavily. Patient reports drinking daily since 2013. Patient drinks a 6 pack of beer and a fifth of whiskey daily. Patient's last drink was 2 days ago. He also reports opiate use 50mg -100mg 's per day. His last use was also 2 days ago. Patient reports several withdrawal symptoms. SEE ADDITIONAL SOCIAL HISTORY for details related to substance abuse. Patient reports suicidal ideations. No plan or intent. No means. No HI. Pt sts that he started hearing voices last night of someone calling his name. Patient has a no prior hx of psychosis. Patient reports visual hallucinations of "spots".  Patient was admitted to [facility] in 2011 for substance abuse. He does not have a current outpatient provider.   Axis I: Alcohol Dependence; Opioid Abuse; Depressive Disorder NOS Axis II: Deferred Axis III:  Past Medical History  Diagnosis Date  . Renal disorder   . Kidney stones   . Anxiety   . Depression   . Opiate withdrawal 11/09/2011   Axis IV: other psychosocial or environmental problems, problems related to social environment, problems with access to health care services and problems with primary support group Axis V: 31-40 impairment in reality testing  Past Medical History:  Past Medical History  Diagnosis Date  . Renal disorder   . Kidney stones   . Anxiety   . Depression   . Opiate withdrawal 11/09/2011    Past Surgical History  Procedure Laterality Date  . L ankle surgery      Plates and screws placed last October at [facility]  . Reconstructive surgery r great toe      Family History:  Family History  Problem Relation Age of Onset  . Heart failure Mother   . Anesthesia problems Neg Hx   . Hypotension Neg Hx   . Malignant hyperthermia Neg Hx   . Pseudochol deficiency Neg Hx     Social History:  reports that he  has been smoking Cigarettes.  He has a 3.75 pack-year smoking history. His smokeless tobacco use includes Snuff. He reports that he does not drink alcohol or use illicit drugs.  Additional Social History:  Alcohol / Drug Use Pain Medications: SEE MAR Prescriptions: SEE MAR Over the Counter: SEE MAR History of alcohol / drug use?: Yes Longest period of sobriety (when/how long): 3 yrs  Negative Consequences of Use: Financial, Personal relationships, Work / School Withdrawal Symptoms: Agitation, Diarrhea, Sweats, DTs, Fever / Chills, Anorexia, Irritability, Tremors, Change in blood pressure, Nausea / Vomiting, Weakness, Cramps Substance #1 Name of Substance 1: Alcohol  1 - Age of First Use: 58 yrs old  1 - Amount (size/oz): 6 pack or more of beer and a fifth of whiskey 1 - Frequency: daily  1 - Duration: "I started drinking heavily in 2013" 1 - Last Use / Amount: 08/16/2014 Substance #2 Name of Substance 2: Opiates (Percocets, Vicodin, and Oxymorphone) 2 - Age of First Use: 58 yrs old  2 - Amount (size/oz): 50mg -100mg  2 - Frequency: daily  2 - Duration: "I started drinking heavily in 2013" 2 - Last Use / Amount: 3 days ago   CIWA: CIWA-Ar BP: 159/95 mmHg Pulse Rate: 94 Nausea and Vomiting: no nausea and no vomiting Tactile Disturbances: very mild itching, pins and needles, burning or numbness Tremor: moderate, with patient's arms extended Auditory Disturbances: not present Paroxysmal Sweats: barely  perceptible sweating, palms moist Visual Disturbances: moderately severe hallucinations Anxiety: moderately anxious, or guarded, so anxiety is inferred Headache, Fullness in Head: none present Agitation: somewhat more than normal activity Orientation and Clouding of Sensorium: oriented and can do serial additions CIWA-Ar Total: 15 COWS:    Allergies: No Known Allergies  Home Medications:  (Not in a hospital admission)  OB/GYN Status:  No LMP for male patient.  General Assessment  Data [facility] of Assessment: [facility] ACT Assessment: Yes Is this a Tele or Face-to-Face Assessment?: Face-to-Face Is this an Initial Assessment or a Re-assessment for this encounter?: Initial Assessment Living Arrangements: Other (Comment), Non-relatives/Friends Can pt return to current living arrangement?: Yes Admission Status: Voluntary Is patient capable of signing voluntary admission?: Yes Transfer from: Acute Hospital Referral Source: Self/Family/Friend     [facility] Living Arrangements: Other (Comment), Non-relatives/Friends Name of Psychiatrist:  (No psychiatrist ) Name of Therapist:  (No theapist )  Education Status Is patient currently in school?: No  Risk to self with the past 6 months Suicidal Ideation: Yes-Currently Present Suicidal Intent: No Is patient at risk for suicide?: Yes Suicidal Plan?: No Access to Means: No What has been your use of drugs/alcohol within the last 12 months?:  (alcohol and opiates) Previous Attempts/Gestures: No How many times?:  (0) Other Self Harm Risks:  (none reported ) Triggers for Past Attempts: Other (Comment) (none reported ) Intentional Self Injurious Behavior: None Family Suicide History: No Recent stressful life event(s): Other (Comment) (substance abuse) Persecutory voices/beliefs?: No Depression: Yes Depression Symptoms: Insomnia, Tearfulness, Isolating, Fatigue, Guilt, Loss of interest in usual pleasures, Feeling worthless/self pity, Feeling angry/irritable, Despondent Substance abuse history and/or treatment for substance abuse?: No Suicide prevention information given to non-admitted patients: Not applicable  Risk to Others within the past 6 months Homicidal Ideation: No Thoughts of Harm to Others: No Current Homicidal Intent: No Current Homicidal Plan: No Access to Homicidal Means: No Identified Victim:  (n/a) History of harm to others?: No Assessment of Violence: None Noted Violent Behavior  Description:  (patient calm and cooperative ) Does patient have access to weapons?: No Criminal Charges Pending?: No Does patient have a court date: No  Psychosis Hallucinations: Visual, Auditory ("I see spots"; "Last night I heard someone calling my name") Delusions: Unspecified  Mental Status Report Appear/Hygiene: Disheveled Eye Contact: Good Motor Activity: Freedom of movement Speech: Logical/coherent Level of Consciousness: Alert Mood: Depressed Affect: Appropriate to circumstance Anxiety Level: Moderate Thought Processes: Coherent, Relevant Judgement: Unimpaired Orientation: Person, Place, Time, Situation Obsessive Compulsive Thoughts/Behaviors: None  Cognitive Functioning Concentration: Decreased Memory: Recent Intact, Remote Intact IQ: Average Insight: Fair Impulse Control: Fair Appetite: Fair Weight Loss:  ("30 pounds in the past several months") Weight Gain:  (none reported ) Sleep: Decreased Total Hours of Sleep:  (2 hrs per night ) Vegetative Symptoms: None  ADLScreening [name] Assessment Services) Patient's cognitive ability adequate to safely complete daily activities?: Yes Patient able to express need for assistance with ADLs?: Yes Independently performs ADLs?: Yes (appropriate for developmental age)  Prior Inpatient Therapy Prior Inpatient Therapy: Yes Prior Therapy Dates:  (2011-[name]) Prior Therapy Facilty/Provider(s):  [name]) Reason for Treatment:  (substance abuse)  Prior Outpatient Therapy Prior Outpatient Therapy: No Prior Therapy Dates:  (n/a) Prior Therapy Facilty/Provider(s):  (n/a) Reason for Treatment:  (n/a)  ADL Screening (condition at time of admission) Patient's cognitive ability adequate to safely complete daily activities?: Yes Is the patient deaf or have difficulty hearing?: No Does the patient have difficulty seeing,  even when wearing glasses/contacts?: No Does the patient have difficulty concentrating, remembering, or making  decisions?: Yes Patient able to express need for assistance with ADLs?: Yes Does the patient have difficulty dressing or bathing?: No Independently performs ADLs?: Yes (appropriate for developmental age) Does the patient have difficulty walking or climbing stairs?: No Weakness of Legs: None Weakness of Arms/Hands: None             Advance Directives (For Healthcare) Does patient have an advance directive?: Yes Would patient like information on creating an advanced directive?: No - patient declined information Nutrition Screen- [facility] Adult/[facility] Patient's home diet: Regular  Additional Information 1:1 In Past 12 Months?: No CIRT Risk: No Elopement Risk: No Does patient have medical clearance?: Yes     Disposition:  Disposition Initial Assessment Completed for this Encounter: Yes Disposition of Patient: Inpatient treatment program Type of inpatient treatment program: Adult ([facility] Dr. Jannifer FranklinAkintayo; obs bed #2)  On Site Evaluation by:   Reviewed with Physician:    Melynda RipplePerry, Kennis Buell [facility] 08/18/2014 11:19 AM

## 2014-08-18 NOTE — H&P (Signed)
[name] OBS UNIT H&P   Subjective: Pt seen and chart reviewed. Pt is denying SI, HI, and AVH. Pt does report feeling overwhelmed with alcoholism. His last drink was "about 2 days ago on Tuesday". However, he reports that he is very concerned about oxycodone/hydrocodone abuse and would like inpatient rehab. Pt started on the Clonidine protocol. Pt's BAL was negative upon admission so we will monitor closely for any alcohol withdrawal symptoms although none noted and pt denies at this time. Pt reports that he is concerned about withdrawal but asymptomatic during this assessment. Pt in agreement to [facility] and spend the night in the OBS Unit for safety/stabilization.   HPI:  Darren Mendoza is an 58 y.o. male with hx of depression, alcoholism, and opiate withdrawal. [facility]. Sts that he drinks heavily. Patient reports drinking daily since 2013. Patient drinks a 6 pack of beer and a fifth of whiskey daily. Patient's last drink was 2 days ago. He also reports opiate use 50mg -100mg 's per day. His last use was also 2 days ago. Patient reports several withdrawal symptoms. SEE ADDITIONAL SOCIAL HISTORY for details related to substance abuse. Patient reports suicidal ideations. No plan or intent. No means. No HI. Pt sts that he started hearing voices last night of someone calling his name. Patient has a no prior hx of psychosis. Patient reports visual hallucinations of "spots".  [facility]. He does not have a current outpatient provider.   Axis I: Alcohol Dependence; Opioid Abuse; MDD, Substance induced mood disorder Axis II: Deferred Axis III:  Past Medical History  Diagnosis Date  . Renal disorder   . Kidney stones   . Anxiety   . Depression   . Opiate withdrawal 11/09/2011   Axis IV: other psychosocial or environmental problems, problems related to social environment, problems with  access to health care services and problems with primary support group Axis V: 41-50 serious symptoms  Psychiatric Specialty Exam: Physical Exam  Review of Systems  Constitutional: Negative.   HENT: Negative.   Eyes: Negative.   Respiratory: Negative.   Cardiovascular: Negative.   Gastrointestinal: Negative.   Genitourinary: Negative.   Musculoskeletal: Negative.   Skin: Negative.   Neurological: Negative.   Endo/Heme/Allergies: Negative.   Psychiatric/Behavioral: Positive for depression and substance abuse. Negative for suicidal ideas and hallucinations. The patient is nervous/anxious and has insomnia.     There were no vitals taken for this visit.There is no weight on file to calculate BMI.  General Appearance: Disheveled  Eye Contact::  Good  Speech:  Clear and Coherent  Volume:  Normal  Mood:  Anxious  Affect:  Appropriate  Thought Process:  Coherent and Goal Directed  Orientation:  Full (Time, Place, and Person)  Thought Content:  Rumination  Suicidal Thoughts:  No  Homicidal Thoughts:  No  Memory:  Immediate;   Fair Recent;   Fair Remote;   Fair  Judgement:  Fair  Insight:  Fair  Psychomotor Activity:  Normal  Concentration:  Good  Recall:  Good  Akathisia:  No  Handed:    AIMS (if indicated):     Assets:  Communication Skills Desire for Improvement Resilience Social Support  Sleep:         Past Medical History:  Past Medical History  Diagnosis Date  . Renal disorder   . Kidney stones   . Anxiety   . Depression   .  Opiate withdrawal 11/09/2011    Past Surgical History  Procedure Laterality Date  . L ankle surgery      [facility]  . Reconstructive surgery r great toe      Family History:  Family History  Problem Relation Age of Onset  . Heart failure Mother   . Anesthesia problems Neg Hx   . Hypotension Neg Hx   . Malignant hyperthermia Neg Hx   . Pseudochol deficiency Neg Hx     Social  History:  reports that he has been smoking Cigarettes.  He has a 3.75 pack-year smoking history. His smokeless tobacco use includes Snuff. He reports that he does not drink alcohol or use illicit drugs.  Additional Social History:     CIWA:   COWS:    Allergies: No Known Allergies  Home Medications:  Medications Prior to Admission  Medication Sig Dispense Refill  . HYDROcodone-acetaminophen (NORCO/VICODIN) 5-325 MG per tablet Take 1-2 tablets by mouth every 6 (six) hours as needed. (Patient not taking: Reported on 08/18/2014) 10 tablet 0  . naproxen (NAPROSYN) 500 MG tablet Take 1 tablet (500 mg total) by mouth 2 (two) times daily. (Patient not taking: Reported on 08/18/2014) 14 tablet 0    OB/GYN Status:  No LMP for male patient.    Disposition:  Hold in OBS UNIT for stabilization. -Refer to inpatient rehab for alcoholism and opiate abuse. -If no bed found within 23 hours, will re-evaluate for safety and will discharge with outpatient resources if stable (pt verbalizes understanding of this)   Beau FannyWithrow, John C, FNP-BC 08/18/2014 1:48 PM

## 2014-08-18 NOTE — Progress Notes (Signed)
Patient ID: Darren Mendoza, male   DOB: Jan 30, 1956, 58 y.o.   MRN: [id number] ADMISSION NOTE: D: 58 year old male  patient admitted voluntarily  to observation unit at 1328. NKDA. Patient presented to [facility] ED requesting alcohol and opiate detox. He stated that he experienced SI on 08/17/2014, but currently denies SI/HI. Patient states that he had "lots of plans for suicide," but did not act on any of the plans. He does report visual hallucinations, stating that he sees spots. He also reported experiencing auditory hallucinations earlier today, stating, "I heard someone calling my name." He is positive for tactile hallucinations, stating that his skin feels "tingling." Visible tremors noted. Patient reports that he has been consuming a 6 pack and a fifth per day. He reports smoking 1/2 pack cigarettes per day. He also reports opiate abuse, stating that he abuses prescription opiates and obtains opiates "off the street" and that he takes hydrocodone, percocet, vicodin, dilautid, and oxycodone.  A: Patient oriented to observation unit and NP contacted for admission orders. Meal and beverages provided for nutrition and hydration. Towels and toiletries provided. Environment adjusted to promote patient comfort. R: patient resting comfortably. He is requesting "long-term alcohol and drug rehab services" upon discharge. He states that he has no social support systems and that his housemates abuse substances as well and that he feels he does not need to return to that environment.

## 2014-08-18 NOTE — BH Assessment (Signed)
[name] Assessment Progress Note  [facility] by Thedore MinsMojeed Akintayo, MD.  Per Melynda Rippleoyka Perry, TS, he has been assigned to Obs bed 2.  Pt signed Voluntary Admission and Consent for Treatment, as well as Consent to Release Information, both [facility] which [facility].  Pt's nurse has been notified.  She agrees to send original paperwork with pt via [facility], and to call report to [phone] or [phone].  Doylene Canninghomas Yuli Lanigan, MA Triage Specialist 08/18/2014 @ 12:48

## 2014-08-18 NOTE — B[facility] Counselor (Signed)
Claudette Headonrad Withrow NP requested Clinical research associatewriter make referrals to [facility] and [facility], However, Clinical research associatewriter reviewed pt's chart and pt's endorsement of SI in the TTS assessment disqualifies him from both [facility] and [facility].   Evette Cristalaroline Paige Zakary Kimura, [place] Assessment Counselor

## 2014-08-18 NOTE — Progress Notes (Signed)
[name] INPATIENT:  Family/Significant Other Suicide Prevention Education  Suicide Prevention Education:  Patient Refusal for Family/Significant Other Suicide Prevention Education: The patient Darren Mendoza has refused to provide written consent for family/significant other to be provided Family/Significant Other Suicide Prevention Education during admission and/or prior to discharge.  Physician notified.  Twana FirstSmith, Adar Rase K 08/18/2014, 4:25 PM

## 2014-08-18 NOTE — ED Notes (Signed)
Called [facility] for transport- [facility]

## 2014-08-18 NOTE — Plan of Care (Signed)
[name] Observation Crisis Plan  Reason for Crisis Plan:  Crisis Stabilization and Substance Abuse   Plan of Care:  Referral for Substance Abuse  Family Support:      Current Living Environment:  Living Arrangements: Non-relatives/Friends  Insurance:   Hospital Account    Name Acct ID Class Status Primary Coverage   Darren Mendoza, Joeph G [id number] BEHAVIORAL HEALTH OBSERVATION [facility] [place] [employer]        Guarantor Account (for Hospital Account [id number])    Name Relation to Pt Service Area Active? Acct Type   Otelia LimesPeay, Khye G Self [facility] Yes Behavioral Health   Address Phone       [address] [place], [place] [address] [phone])          Coverage Information (for Hospital Account [id number])    F/O Payor/Plan Precert #   [facility]    Subscriber Subscriber #   Darren Mendoza, Raif G [id number]   Address Phone   [address] [place], [place] [address] [phone]      Legal Guardian:     Primary Care Provider:  Default, Provider, MD  Current Outpatient Providers:  unknown  Psychiatrist:     Counselor/Therapist:     Compliant with Medications:  No  Additional Information:   Twana FirstSmith, Madelein Mahadeo K 12/3/20154:22 PM

## 2014-08-18 NOTE — ED Notes (Signed)
[facility]-notified Observation that pt is en route, belongings sent with pt.

## 2014-08-18 NOTE — ED Notes (Signed)
Pt requesting detox from opiates and ETOH, last use yesterday. Denies SI/HI.

## 2014-08-18 NOTE — BH Assessment (Signed)
Writer informed the TTS Therapist Jessie Foot(Toyka) of the consult.

## 2014-08-18 NOTE — ED Provider Notes (Signed)
CSN: [id number]     Arrival date & time 08/18/14  [id number] History   First MD Initiated Contact with Patient 08/18/14 1032     Chief Complaint  Patient presents with  . detox from ETOH and opiates      (Consider location/radiation/quality/duration/timing/severity/associated sxs/prior Treatment) Patient is a 58 y.o. male presenting with alcohol problem. The history is provided by the patient. No language interpreter was used.  Alcohol Problem This is a new problem. The current episode started yesterday. The problem occurs constantly. The problem has been gradually worsening. Associated symptoms include nausea. Pertinent negatives include no vomiting. Nothing aggravates the symptoms. He has tried nothing for the symptoms. The treatment provided no relief.  Pt request in patient treatment for alcohol abuse.  Pt complains of feeling shakey.   Pt has thoughts of self harm but no plan.   He reports he almost drank rubbing alcohol yesterday but did not  Past Medical History  Diagnosis Date  . Renal disorder   . Kidney stones   . Anxiety   . Depression   . Opiate withdrawal 11/09/2011   Past Surgical History  Procedure Laterality Date  . L ankle surgery      [facility]  . Reconstructive surgery r great toe     Family History  Problem Relation Age of Onset  . Heart failure Mother   . Anesthesia problems Neg Hx   . Hypotension Neg Hx   . Malignant hyperthermia Neg Hx   . Pseudochol deficiency Neg Hx    History  Substance Use Topics  . Smoking status: Current Every Day Smoker -- 0.25 packs/day for 15 years    Types: Cigarettes  . Smokeless tobacco: Current User    Types: Snuff  . Alcohol Use: No    Review of Systems  Gastrointestinal: Positive for nausea. Negative for vomiting.  All other systems reviewed and are negative.     Allergies  Review of patient's allergies indicates no known allergies.  Home Medications   Prior to  Admission medications   Medication Sig Start Date End Date Taking? Authorizing Provider  HYDROcodone-acetaminophen (NORCO/VICODIN) 5-325 MG per tablet Take 1-2 tablets by mouth every 6 (six) hours as needed. 08/15/14   Vanetta MuldersScott Zackowski, MD  ibuprofen (ADVIL,MOTRIN) 200 MG tablet Take 800 mg by mouth every 6 (six) hours as needed for mild pain or moderate pain.    Historical Provider, MD  naproxen (NAPROSYN) 500 MG tablet Take 1 tablet (500 mg total) by mouth 2 (two) times daily. 08/15/14   Vanetta MuldersScott Zackowski, MD   BP 159/95 mmHg  Pulse 94  Temp(Src) 98.2 F (36.8 C) (Oral)  Resp 20  SpO2 98% Physical Exam  Constitutional: He is oriented to person, place, and time. He appears well-developed and well-nourished.  HENT:  Head: Normocephalic.  Eyes: EOM are normal.  Neck: Normal range of motion.  Pulmonary/Chest: Effort normal.  Abdominal: He exhibits no distension.  Musculoskeletal: Normal range of motion.  Neurological: He is alert and oriented to person, place, and time.  Psychiatric: He has a normal mood and affect.  Nursing note and vitals reviewed.   ED Course  Procedures (including critical care time) Labs Review Labs Reviewed  CBC  ACETAMINOPHEN LEVEL  COMPREHENSIVE METABOLIC PANEL  ETHANOL  SALICYLATE LEVEL  URINE RAPID DRUG SCREEN (HOSP PERFORMED)    Imaging Review No results found.   EKG Interpretation None      MDM  Pt depressed and  having thoughts of self harm.  Significant alcohol abuse as well as narcotic abuse.  Pt to be evaluated.   Final diagnoses:  Alcohol abuse  Depressed mood       Lonia SkinnerLeslie K HumphreySofia, [place] JerseyPA-C 08/18/14 1043  Toy CookeyMegan Docherty, MD 08/18/14 1752

## 2014-08-19 ENCOUNTER — Encounter (HOSPITAL_COMMUNITY): Payer: Self-pay | Admitting: *Deleted

## 2014-08-19 DIAGNOSIS — F1994 Other psychoactive substance use, unspecified with psychoactive substance-induced mood disorder: Secondary | ICD-10-CM

## 2014-08-19 DIAGNOSIS — R45851 Suicidal ideations: Secondary | ICD-10-CM | POA: Diagnosis present

## 2014-08-19 DIAGNOSIS — Z609 Problem related to social environment, unspecified: Secondary | ICD-10-CM | POA: Diagnosis present

## 2014-08-19 DIAGNOSIS — Z639 Problem related to primary support group, unspecified: Secondary | ICD-10-CM | POA: Diagnosis not present

## 2014-08-19 DIAGNOSIS — Z8249 Family history of ischemic heart disease and other diseases of the circulatory system: Secondary | ICD-10-CM | POA: Diagnosis not present

## 2014-08-19 DIAGNOSIS — G4700 Insomnia, unspecified: Secondary | ICD-10-CM | POA: Diagnosis present

## 2014-08-19 DIAGNOSIS — F1123 Opioid dependence with withdrawal: Secondary | ICD-10-CM | POA: Diagnosis present

## 2014-08-19 DIAGNOSIS — F17210 Nicotine dependence, cigarettes, uncomplicated: Secondary | ICD-10-CM | POA: Diagnosis present

## 2014-08-19 DIAGNOSIS — F10232 Alcohol dependence with withdrawal with perceptual disturbance: Secondary | ICD-10-CM | POA: Diagnosis present

## 2014-08-19 DIAGNOSIS — F32A Depression, unspecified: Secondary | ICD-10-CM | POA: Diagnosis present

## 2014-08-19 DIAGNOSIS — F332 Major depressive disorder, recurrent severe without psychotic features: Secondary | ICD-10-CM | POA: Diagnosis present

## 2014-08-19 DIAGNOSIS — F418 Other specified anxiety disorders: Secondary | ICD-10-CM | POA: Diagnosis present

## 2014-08-19 DIAGNOSIS — Y900 Blood alcohol level of less than 20 mg/100 ml: Secondary | ICD-10-CM | POA: Diagnosis present

## 2014-08-19 DIAGNOSIS — F329 Major depressive disorder, single episode, unspecified: Secondary | ICD-10-CM | POA: Diagnosis present

## 2014-08-19 MED ORDER — LORAZEPAM 1 MG PO TABS
1.0000 mg | ORAL_TABLET | Freq: Every day | ORAL | Status: AC
  Administered 2014-08-23: 1 mg via ORAL
  Filled 2014-08-19: qty 1

## 2014-08-19 MED ORDER — LORAZEPAM 1 MG PO TABS
1.0000 mg | ORAL_TABLET | Freq: Two times a day (BID) | ORAL | Status: AC
  Administered 2014-08-22 (×2): 1 mg via ORAL
  Filled 2014-08-19 (×2): qty 1

## 2014-08-19 MED ORDER — LORAZEPAM 1 MG PO TABS
ORAL_TABLET | ORAL | Status: AC
  Filled 2014-08-19: qty 1

## 2014-08-19 MED ORDER — DEXMEDETOMIDINE HCL IN NACL 200 MCG/50ML IV SOLN: 0.2000 ug/kg/h | INTRAVENOUS | Status: DC

## 2014-08-19 MED ORDER — ADULT MULTIVITAMIN W/MINERALS CH
1.0000 | ORAL_TABLET | Freq: Every day | ORAL | Status: DC
  Administered 2014-08-19 – 2014-08-24 (×6): 1 via ORAL
  Filled 2014-08-19 (×2): qty 1
  Filled 2014-08-19: qty 14
  Filled 2014-08-19 (×6): qty 1

## 2014-08-19 MED ORDER — LORAZEPAM 1 MG PO TABS: 1.0000 mg | ORAL_TABLET | Freq: Four times a day (QID) | ORAL | Status: DC | PRN

## 2014-08-19 MED ORDER — VITAMIN B-1 100 MG PO TABS
ORAL_TABLET | ORAL | Status: AC
  Filled 2014-08-19: qty 1

## 2014-08-19 MED ORDER — LORAZEPAM 1 MG PO TABS
1.0000 mg | ORAL_TABLET | Freq: Four times a day (QID) | ORAL | Status: AC
  Administered 2014-08-19 – 2014-08-20 (×6): 1 mg via ORAL
  Filled 2014-08-19 (×3): qty 1

## 2014-08-19 MED ORDER — VITAMIN B-1 100 MG PO TABS
100.0000 mg | ORAL_TABLET | Freq: Every day | ORAL | Status: DC
  Administered 2014-08-20 – 2014-08-24 (×5): 100 mg via ORAL
  Filled 2014-08-19 (×7): qty 1

## 2014-08-19 MED ORDER — ADULT MULTIVITAMIN W/MINERALS CH
ORAL_TABLET | ORAL | Status: AC
  Filled 2014-08-19: qty 1

## 2014-08-19 MED ORDER — LOPERAMIDE HCL 2 MG PO CAPS: 2.0000 mg | ORAL_CAPSULE | ORAL | Status: DC | PRN

## 2014-08-19 MED ORDER — LORAZEPAM 1 MG PO TABS
1.0000 mg | ORAL_TABLET | Freq: Three times a day (TID) | ORAL | Status: AC
  Administered 2014-08-21 (×2): 1 mg via ORAL
  Filled 2014-08-19 (×3): qty 1

## 2014-08-19 MED ORDER — LORAZEPAM 1 MG PO TABS
1.0000 mg | ORAL_TABLET | Freq: Once | ORAL | Status: AC
  Administered 2014-08-19: 1 mg via ORAL

## 2014-08-19 MED ORDER — LORAZEPAM 1 MG PO TABS
1.0000 mg | ORAL_TABLET | Freq: Four times a day (QID) | ORAL | Status: AC | PRN
  Administered 2014-08-20 – 2014-08-21 (×3): 1 mg via ORAL
  Filled 2014-08-19 (×3): qty 1

## 2014-08-19 MED ORDER — THIAMINE HCL 100 MG/ML IJ SOLN
100.0000 mg | Freq: Once | INTRAMUSCULAR | Status: AC
  Administered 2014-08-19: 100 mg via INTRAMUSCULAR

## 2014-08-19 NOTE — Progress Notes (Signed)
D: Patient mood and affect depressed and anxious. Patient states that he is depressed. He is tearful at times, particularly when verbalizing his feelings of hopelessness and helplessness regarding his current situation. He requests long-term rehab and expresses the desire to remain substance free after detox. He has stated this morning, "I don't feel well." Complaints have included nausea, generalized aches/discomfort, and chills/sweating. A: Support provided through active listening. Medications administered per order. Patient seen by both NP and Social Worker this morning. Environment adjusted for patient comfort. R: Patient states that he does not feel that he can return to his current living situation as the people he has been living with all struggle with substance abuse. He has been resting throughout the morning, sleeping intermittently. Safety maintained via checks every 15 minutes. He verbally contracts for safety.

## 2014-08-19 NOTE — Tx Team (Signed)
Initial Interdisciplinary Treatment Plan   PATIENT STRESSORS: Financial difficulties Health problems Substance abuse   PATIENT STRENGTHS: Ability for insight Active sense of humor   PROBLEM LIST: Problem List/Patient Goals Date to be addressed Date deferred Reason deferred Estimated date of resolution  Substance abuse 08/19/14   dc                                                   DISCHARGE CRITERIA:  Ability to meet basic life and health needs Improved stabilization in mood, thinking, and/or behavior Motivation to continue treatment in a less acute level of care Reduction of life-threatening or endangering symptoms to within safe limits  PRELIMINARY DISCHARGE PLAN: Attend aftercare/continuing care group Attend 12-step recovery group Placement in alternative living arrangements Return to previous living arrangement  PATIENT/FAMIILY INVOLVEMENT: This treatment plan has been presented to and reviewed with the patient, Darren Mendoza, and/or family member, .  The patient and family have been given the opportunity to ask questions and make suggestions.  Malva LimesStrader, Magalie Almon 08/19/2014, 3:34 PM

## 2014-08-19 NOTE — Progress Notes (Signed)
[facility]. Report called to [place], Charity fundraiserN.

## 2014-08-19 NOTE — Progress Notes (Signed)
Pt has been resting in bed.  Pt is having minimal withdrawal symptoms at this time.  Snack was provided to pt.  Still denies SI/HI/AV.  Pt makes his needs known to staff.  Safety maintained with q15 minute checks.

## 2014-08-19 NOTE — Progress Notes (Signed)
Patient ID: Darren Mendoza, male   DOB: 21-Jul-1956, 58 y.o.   MRN: [id number] Admit note. Pt presents for [facility].  He reports drinking heavily and using opaites x 4 years, drinking up to 6 ppd and fifth of liquor daily. Patient reports passive SI. Pt upon admission is irritable but cooperative with admission process. He is able to contract for safety. Pt skin searched and noted to right heel large patch of ''psorasis' per pt report with reddened scaly rash. No contraband noted. Pt oriented to unit. Pt c/o severe withdrawal symptoms including diarrhea, anxiety, shakes and noted tremors to hands. Notified Dr. Elna BreslowEappen of above information. Orders received. Pt oriented to unit . Low Fall Risk.

## 2014-08-19 NOTE — Progress Notes (Signed)
[facility] MD Observation Progress Note  08/19/2014 12:56 PM Darren Mendoza  MRN:  [id number] Subjective:  Darren Mendoza is a 58 year old caucasian male w with hx of depression, alcoholism, and opiate withdrawal. Patient presented yesterday to [facility] seeking substance abuse help. Sts that he drinks heavily. Patient reports drinking daily since 2013. Patient drinks a 6 pack of beer and a fifth of whiskey daily. Patient's last drink was 2 days ago. He also reports opiate use 34m-100mg's per day. His last use was also 2 days ago. Patient reports several withdrawal symptoms including chills, nausea, fine hand tremors, and body shakes.  Patient reports that he uses to function rather than to achieve a high.   Patient is tearful throughout the assessment reporting that he has been feeling hopeless and he needs help patient endorses suicidal ideation with a plan to slit his writs.  Patient has a long history of mental illness and substance abuse and dependence.  He was [facility] and has been to several substance recovery programs the last being 4 years ago.  Patient is not currently on any psychotropic medications.   Diagnosis:   DSM5: substance dependence with uncomplicated withdrawal and substance induced mood disorder.  Schizophrenia Disorders:  None  Obsessive-Compulsive Disorders: none Trauma-Stressor Disorders: denies Substance/Addictive Disorders:  Opioid Disorder - Severe (304.00) Depressive Disorders: substance induced mood disorder  Total Time spent with patient: 30 minutes  Axis I: Substance Induced Mood Disorder  Opioid dependence with uncomplicated withdrawal ADL's:  Intact  Sleep: Good  Appetite:  Good  Suicidal Ideation:  Plan:  to cut his wrists Homicidal Ideation:  Denies    Psychiatric Specialty Exam: Physical Exam  Constitutional: He appears well-developed and well-nourished. No distress.  HENT:  Head: Normocephalic and  atraumatic.    Review of Systems  Constitutional: Positive for chills and malaise/fatigue. Negative for fever.  Eyes: Negative.   Respiratory: Negative.   Cardiovascular: Negative.   Gastrointestinal: Positive for nausea.  Genitourinary: Negative.   Musculoskeletal: Positive for myalgias.  Skin: Negative.   Neurological: Positive for headaches.  Endo/Heme/Allergies: Negative.   Psychiatric/Behavioral: Positive for depression, suicidal ideas and substance abuse. The patient is nervous/anxious.     Blood pressure 135/94, pulse 64, temperature 97.9 F (36.6 C), temperature source Oral, resp. rate 16, height 5' 8.31" (1.735 m), weight 105.235 kg (232 lb), SpO2 97 %.Body mass index is 34.96 kg/(m^2).  General Appearance: Casual  Eye Contact::  Fair  Speech:  Clear and Coherent  Volume:  Normal  Mood:  Anxious and Depressed  Affect:  Congruent  Thought Process:  Coherent, Goal Directed and Logical  Orientation:  Full (Time, Place, and Person)  Thought Content:  WDL  Suicidal Thoughts:  Yes.  with intent/plan  Homicidal Thoughts:  No  Memory:  Immediate;   Good Recent;   Good Remote;   Good  Judgement:  Fair  Makes independent detrimental decisions  Insight:  Fair  Psychomotor Activity:  Decreased  Concentration:  Fair  Recall:  Good  Fund of Knowledge:Fair  Language: Good  Akathisia:  NA  Handed:  Right  AIMS (if indicated):     Assets:  Desire for Improvement Social Support Vocational/Educational  Sleep:      Musculoskeletal: Strength & Muscle Tone: within normal limits Gait & Station: normal Patient leans: N/A  Current Medications: Current Facility-Administered Medications  Medication Dose Route Frequency Provider Last Rate Last Dose  . acetaminophen (TYLENOL) tablet 650  mg  650 mg Oral Q6H PRN Benjamine Mola, FNP      . alum & mag hydroxide-simeth (MAALOX/MYLANTA) 200-200-20 MG/5ML suspension 30 mL  30 mL Oral Q4H PRN Benjamine Mola, FNP      . cloNIDine  (CATAPRES) tablet 0.1 mg  0.1 mg Oral QID Benjamine Mola, FNP   0.1 mg at 08/19/14 1201   Followed by  . [START ON 08/20/2014] cloNIDine (CATAPRES) tablet 0.1 mg  0.1 mg Oral BH-qamhs Benjamine Mola, FNP       Followed by  . [START ON 08/23/2014] cloNIDine (CATAPRES) tablet 0.1 mg  0.1 mg Oral QAC breakfast Benjamine Mola, FNP      . dicyclomine (BENTYL) tablet 20 mg  20 mg Oral Q6H PRN Benjamine Mola, FNP   20 mg at 08/18/14 1648  . hydrOXYzine (ATARAX/VISTARIL) tablet 25 mg  25 mg Oral QHS PRN Benjamine Mola, FNP   25 mg at 08/18/14 2142  . loperamide (IMODIUM) capsule 2-4 mg  2-4 mg Oral PRN Benjamine Mola, FNP   4 mg at 08/19/14 1201  . magnesium hydroxide (MILK OF MAGNESIA) suspension 30 mL  30 mL Oral Daily PRN Benjamine Mola, FNP      . methocarbamol (ROBAXIN) tablet 500 mg  500 mg Oral Q8H PRN Benjamine Mola, FNP      . naproxen (NAPROSYN) tablet 500 mg  500 mg Oral BID PRN Benjamine Mola, FNP   500 mg at 08/19/14 4562  . ondansetron (ZOFRAN-ODT) disintegrating tablet 4 mg  4 mg Oral Q6H PRN Benjamine Mola, FNP   4 mg at 08/19/14 5638    Lab Results:  Results for orders placed or performed during the hospital encounter of 08/18/14 (from the past 48 hour(s))  Acetaminophen level     Status: None   Collection Time: 08/18/14  9:56 AM  Result Value Ref Range   Acetaminophen (Tylenol), Serum <15.0 10 - 30 ug/mL    Comment:        THERAPEUTIC CONCENTRATIONS VARY SIGNIFICANTLY. A RANGE OF 10-30 ug/mL MAY BE AN EFFECTIVE CONCENTRATION FOR MANY PATIENTS. HOWEVER, SOME ARE BEST TREATED AT CONCENTRATIONS OUTSIDE THIS RANGE. ACETAMINOPHEN CONCENTRATIONS >150 ug/mL AT 4 HOURS AFTER INGESTION AND >50 ug/mL AT 12 HOURS AFTER INGESTION ARE OFTEN ASSOCIATED WITH TOXIC REACTIONS.   CBC     Status: None   Collection Time: 08/18/14  9:56 AM  Result Value Ref Range   WBC 8.2 4.0 - 10.5 K/uL   RBC 4.88 4.22 - 5.81 MIL/uL   Hemoglobin 14.3 13.0 - 17.0 g/dL   HCT 42.4 39.0 - 52.0 %   MCV 86.9  78.0 - 100.0 fL   MCH 29.3 26.0 - 34.0 pg   MCHC 33.7 30.0 - 36.0 g/dL   RDW 14.3 11.5 - 15.5 %   Platelets 198 150 - 400 K/uL  Comprehensive metabolic panel     Status: Abnormal   Collection Time: 08/18/14  9:56 AM  Result Value Ref Range   Sodium 139 137 - 147 mEq/L   Potassium 3.8 3.7 - 5.3 mEq/L   Chloride 99 96 - 112 mEq/L   CO2 22 19 - 32 mEq/L   Glucose, Bld 147 (H) 70 - 99 mg/dL   BUN 13 6 - 23 mg/dL   Creatinine, Ser 0.68 0.50 - 1.35 mg/dL   Calcium 9.9 8.4 - 10.5 mg/dL   Total Protein 7.9 6.0 - 8.3 g/dL   Albumin 4.4 3.5 -  5.2 g/dL   AST 55 (H) 0 - 37 U/L   ALT 54 (H) 0 - 53 U/L   Alkaline Phosphatase 94 39 - 117 U/L   Total Bilirubin 1.1 0.3 - 1.2 mg/dL   GFR calc non Af Amer >90 >90 mL/min   GFR calc Af Amer >90 >90 mL/min    Comment: (NOTE) The eGFR has been calculated using the CKD EPI equation. This calculation has not been validated in all clinical situations. eGFR's persistently <90 mL/min signify possible Chronic Kidney Disease.    Anion gap 18 (H) 5 - 15  Ethanol (ETOH)     Status: None   Collection Time: 08/18/14  9:56 AM  Result Value Ref Range   Alcohol, Ethyl (B) <11 0 - 11 mg/dL    Comment:        LOWEST DETECTABLE LIMIT FOR SERUM ALCOHOL IS 11 mg/dL FOR MEDICAL PURPOSES ONLY   Salicylate level     Status: Abnormal   Collection Time: 08/18/14  9:56 AM  Result Value Ref Range   Salicylate Lvl <0.9 (L) 2.8 - 20.0 mg/dL  Urine Drug Screen     Status: Abnormal   Collection Time: 08/18/14  9:56 AM  Result Value Ref Range   Opiates POSITIVE (A) NONE DETECTED   Cocaine NONE DETECTED NONE DETECTED   Benzodiazepines NONE DETECTED NONE DETECTED   Amphetamines NONE DETECTED NONE DETECTED   Tetrahydrocannabinol NONE DETECTED NONE DETECTED   Barbiturates NONE DETECTED NONE DETECTED    Comment:        DRUG SCREEN FOR MEDICAL PURPOSES ONLY.  IF CONFIRMATION IS NEEDED FOR ANY PURPOSE, NOTIFY LAB WITHIN 5 DAYS.        LOWEST DETECTABLE LIMITS FOR  URINE DRUG SCREEN Drug Class       Cutoff (ng/mL) Amphetamine      1000 Barbiturate      200 Benzodiazepine   983 Tricyclics       382 Opiates          300 Cocaine          300 THC              50     Physical Findings: AIMS: Facial and Oral Movements Muscles of Facial Expression: None, normal Lips and Perioral Area: None, normal Jaw: None, normal Tongue: None, normal,Extremity Movements Upper (arms, wrists, hands, fingers): None, normal Lower (legs, knees, ankles, toes): None, normal, Trunk Movements Neck, shoulders, hips: None, normal, Overall Severity Severity of abnormal movements (highest score from questions above): None, normal Incapacitation due to abnormal movements: None, normal Patient's awareness of abnormal movements (rate only patient's report): No Awareness, Dental Status Current problems with teeth and/or dentures?: Yes (missing majority of lower teeth and a few upper teeth; denies pain/discomfort) Does patient usually wear dentures?: No  CIWA:  CIWA-Ar Total: 18 COWS:  COWS Total Score: 6  Treatment Plan Summary: Daily contact with patient to assess and evaluate symptoms and progress in treatment admit to [facility] room 506-01  for stabilization of mood and to maintain safety   Plan:  Continue to monitor for signs and symptoms of withdrawal Admit to inpatient psychiatric unit at [facility] for stabilization of mood.   Medical Decision Making Problem Points:  Established problem, worsening (2) Data Points:  Review of medication regiment & side effects (2)    Kennedy Bucker PMH-NP  08/19/2014, 12:56 PM

## 2014-08-19 NOTE — Progress Notes (Signed)
[name] Group Notes:  (Nursing/MHT/Case Management/Adjunct)  Date:  08/19/2014  Time:  9:48 PM  Type of Therapy:  Group Therapy  Participation Level:  Did Not Attend  Participation Quality:  Did not Attend  Affect:  Did not Attend  Cognitive:  Did not Attend  Insight:  None  Engagement in Group:  Did not Attend  Modes of Intervention:  Socialization and Support  Summary of Progress/Problems: Pt. Was resting in bed.  Sondra ComeWilson, Haliey Romberg J 08/19/2014, 9:48 PM

## 2014-08-20 DIAGNOSIS — R45851 Suicidal ideations: Secondary | ICD-10-CM

## 2014-08-20 DIAGNOSIS — F332 Major depressive disorder, recurrent severe without psychotic features: Secondary | ICD-10-CM

## 2014-08-20 DIAGNOSIS — F1010 Alcohol abuse, uncomplicated: Secondary | ICD-10-CM

## 2014-08-20 MED ORDER — TRAZODONE HCL 50 MG PO TABS
50.0000 mg | ORAL_TABLET | Freq: Every evening | ORAL | Status: DC | PRN
  Administered 2014-08-20: 50 mg via ORAL
  Filled 2014-08-20: qty 1

## 2014-08-20 MED ORDER — FLUOXETINE HCL 10 MG PO CAPS
10.0000 mg | ORAL_CAPSULE | Freq: Every day | ORAL | Status: DC
  Administered 2014-08-21 – 2014-08-22 (×2): 10 mg via ORAL
  Filled 2014-08-20 (×4): qty 1

## 2014-08-20 MED ORDER — HYDROXYZINE HCL 50 MG/ML IM SOLN
50.0000 mg | Freq: Three times a day (TID) | INTRAMUSCULAR | Status: DC | PRN
  Filled 2014-08-20: qty 1

## 2014-08-20 MED ORDER — LORAZEPAM 1 MG PO TABS
ORAL_TABLET | ORAL | Status: AC
  Filled 2014-08-20: qty 1

## 2014-08-20 NOTE — [facility] Suicide Risk Assessment (Signed)
   Nursing information obtained from:  Patient Demographic factors:  Male, Caucasian, Low socioeconomic status Current Mental Status:  Self-harm behaviors Loss Factors:  Financial problems / change in socioeconomic status Historical Factors:  NA Risk Reduction Factors:  Positive coping skills or problem solving skills Total Time spent with patient: 1 hour  CLINICAL FACTORS:   Depression:   Comorbid alcohol abuse/dependence  Psychiatric Specialty Exam: Physical Exam  ROS  Blood pressure 117/71, pulse 56, temperature 97.5 F (36.4 C), temperature source Oral, resp. rate 16, height 5' 8.31" (1.735 m), weight 105.235 kg (232 lb), SpO2 97 %.Body mass index is 34.96 kg/(m^2).  General Appearance: Disheveled  Eye SolicitorContact::  Fair  Speech:  Normal Rate  Volume:  Normal  Mood:  Depressed  Affect:  Depressed  Thought Process:  Goal Directed  Orientation:  Full (Time, Place, and Person)  Thought Content:  Hallucinations: Auditory Command:  "your life is no good, just do it and run with it" Visual  Suicidal Thoughts:  Yes.  with intent/plan  Homicidal Thoughts:  No  Memory:  Negative  Judgement:  Poor  Insight:  Shallow  Psychomotor Activity:  Decreased  Concentration:  Fair  Recall:  Fair  Fund of Knowledge:Fair  Language: Fair  Akathisia:  Negative  Handed:  Right  AIMS (if indicated):     Assets:  Communication Skills Desire for Improvement  Sleep:      Musculoskeletal: Strength & Muscle Tone: within normal limits Gait & Station: normal Patient leans: N/A  COGNITIVE FEATURES THAT CONTRIBUTE TO RISK:  Closed-mindedness Loss of executive function Polarized thinking Thought constriction (tunnel vision)    SUICIDE RISK:   Extreme:  Frequent, intense, and enduring suicidal ideation, specific plans, clear subjective and objective intent, impaired self-control, severe dysphoria/symptomatology, many risk factors and no protective factors.  PLAN OF CARE:  I certify that  inpatient services furnished can reasonably be expected to improve the patient's condition.  Ancil LinseySARANGA, Jewelene Mairena 08/20/2014, 5:10 PM

## 2014-08-20 NOTE — Progress Notes (Signed)
D  Pt. Denies SI and HI, no complaints of pain or discomfort noted.  A Writer offered support and encouragement,  Rates his depression and anxiety at an 8 today, given ativan PRN  R Pt. Remains safe on the unit,   Presently resting quietly in his room.

## 2014-08-20 NOTE — Progress Notes (Signed)
D. Pt had been up and visible in milieu this evening, pt unable to participate in evening group session. Pt c/o various withdrawal symptoms including anxiety and pt is visibly tremulous this evening. Pt did receive medications this evening without incident. A. Support and encouragement provided. R. Safety maintained, will continue to monitor.

## 2014-08-20 NOTE — Progress Notes (Signed)
Patient ID: Darren Mendoza, male   DOB: 1956-07-15, 58 y.o.   MRN: [id number] D. Pt presents with depressed mood, affect anxious. Darren Mendoza reports he is continuing to have various withdrawal symptoms including anxiety, shakes, auditory hallucinations and stomach cramps. Darren Mendoza states '' i didn't sleep at all last night and I still need something for my nerves. i am hearing things call my name and I just feel bad. I need to see the doctor .'' A. Discussed above information with Dr. Tawni CarnesSaranga. Medications given as ordered including several prn medications for withdrawal. Support and encouragement provided. R. Pt is safe, in no acute distress at this time. Will continue to monitor q 15 minutes for safety.
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
## 2014-08-20 NOTE — Progress Notes (Signed)
Adult Psychoeducational Group Note  Date:  08/20/2014 Time:  9:37 PM  Group Topic/Focus:  Wrap-Up Group:   The focus of this group is to help patients review their daily goal of treatment and discuss progress on daily workbooks.  Participation Level:  Active  Participation Quality:  Appropriate  Affect:  Appropriate  Cognitive:  Appropriate  Insight: Appropriate  Engagement in Group:  Engaged  Modes of Intervention:  Discussion  Additional Comments:  The patient did not attend the earlier group.  Octavio Mannshigpen, Lalana Wachter Lee 08/20/2014, 9:37 PM

## 2014-08-20 NOTE — [facility] Group Notes (Signed)
[name] Group Notes:  (Nursing/MHT/Case Management/Adjunct)  Date:  08/20/2014  Time: 0930 am  Type of Therapy:  Psychoeducational Skills  Participation Level:  Active  Participation Quality:  Appropriate  Affect:  Appropriate  Cognitive:  Alert  Insight:  Appropriate  Engagement in Group:  Developing/Improving  Modes of Intervention:  Support  Summary of Progress/Problems:  Darren Mendoza, Darren Mendoza 08/20/2014, 10:47 AM

## 2014-08-20 NOTE — [facility] Group Notes (Signed)
[name] LCSW Group Therapy Note  08/20/2014 11:15 AM  Type of Therapy and Topic:  Group Therapy: Avoiding Self-Sabotaging and Enabling Behaviors  Participation Level:  Active  Mood: Flat/depressed  Description of Group:     Learn how to identify obstacles, self-sabotaging and enabling behaviors, what are they, why do we do them and what needs do these behaviors meet? Discuss unhealthy relationships and how to have positive healthy boundaries with those that sabotage and enable. Explore aspects of self-sabotage and enabling in yourself and how to limit these self-destructive behaviors in everyday life. A scaling question is used to help patient look at where they are now in their motivation to change, from 1 to 10 (lowest to highest motivation).  Therapeutic Goals: 1. Patient will identify one obstacle that relates to self-sabotage and enabling behaviors 2. Patient will identify one personal self-sabotaging or enabling behavior they did prior to admission 3. Patient able to establish a plan to change the above identified behavior they did prior to admission:  4. Patient will demonstrate ability to communicate their needs through discussion and/or role plays.   Summary of Patient Progress: The main focus of today's process group was to discuss what "self-sabotage" means and use Motivational Interviewing to discuss what benefits, negative or positive, were involved in a self-identified self-sabotaging behavior. We then talked about reasons the patient may want to change the behavior and his/her current desire to change. A scaling question was used to help patient look at where they are now in motivation for change, from 1 to 10 (lowest to highest motivation).  Patient shared during group warm up he is has no feelings about coming holidays. He shared that substance abuse has taken away my family, friends and many of my feelings. Patient shared he is hopeful to enter a treatment program following  discharge here. He did not contribute to group discussion on self sabotage yet appeared to be attentive and tracking discussion.   Therapeutic Modalities:   Cognitive Behavioral Therapy Person-Centered Therapy Motivational Interviewing   Carney Bernatherine C Mackenze Grandison, LCSW

## 2014-08-20 NOTE — H&P (Signed)
Psychiatric Admission Assessment Adult  Patient Identification:  Darren Mendoza Date of Evaluation:  08/20/2014 Chief Complaint:  ALCOHOL DEPRENDENCE  OPIATE ABUSE History of Present Illness: Per observation assessment: [name] is a 58 year old caucasian male w with hx of depression, alcoholism, and opiate withdrawal. Patient presented to [facility] seeking substance abuse help. Sts that he drinks heavily. Patient reports drinking daily since 2013. Patient drinks a 6 pack of beer and a fifth of whiskey daily. Patient's last drink was 2 days ago. He also reports opiate use 50m-100mg's per day. His last use was also 2 days ago. Patient reports several withdrawal symptoms including chills, nausea, fine hand tremors, and body shakes. Patient reports that he uses to function rather than to achieve a high. Patient is tearful throughout the assessment reporting that he has been feeling hopeless and he needs help patient endorses suicidal ideation with a plan to slit his writs. Patient has a long history of mental illness and substance abuse and dependence. He was last hospitalized a [facility] ago [facility] and has been to several substance recovery programs the last being 4 years ago. Patient is not currently on any psychotropic medications.  Upon interview today: pt reports drinking 6 pack per day to 1/5 of liquor per day. Pt has been using percocet, vicodin, other opiates. [facility] Current withdrawal sxs include cramps, seeing spots, insomnia, hearing someone calling his name. Mood is "down". Poor appetite (lost 35 lbs over 3 months). Pt reports on the way to the hospital having suicidal ideation with a plan to cut his wrists or walk into traffic. No HI. He reports AH telling him "your life is no good, just do it and run with it". +anxiety/fear/worry. No previous psychotropic meds. [facility]    Elements:   Location:  depression. Quality:  depression. Severity:  severe. Timing:  recent. Duration:  recent. Context:  alcohol/opiate use. Associated Signs/Synptoms: Depression Symptoms:  depressed mood, anhedonia, insomnia, psychomotor retardation, fatigue, feelings of worthlessness/guilt, difficulty concentrating, hopelessness, suicidal thoughts with specific plan, anxiety, insomnia, loss of energy/fatigue, disturbed sleep, weight loss, decreased appetite, (Hypo) Manic Symptoms:  denies Anxiety Symptoms:  Excessive Worry, Psychotic Symptoms:  Hallucinations: Auditory Command:  "your life is no good, just do it and run with it" Visual PTSD Symptoms: Negative Total Time spent with patient: 1 hour  Psychiatric Specialty Exam: Physical Exam  ROS  Blood pressure 117/71, pulse 56, temperature 97.5 F (36.4 C), temperature source Oral, resp. rate 16, height 5' 8.31" (1.735 m), weight 105.235 kg (232 lb), SpO2 97 %.Body mass index is 34.96 kg/(m^2).  General Appearance: Disheveled  Eye Contact::  Minimal  Speech:  Normal Rate  Volume:  Normal  Mood:  Depressed  Affect:  Congruent and Depressed  Thought Process:  Goal Directed  Orientation:  Full (Time, Place, and Person)  Thought Content:  Hallucinations: Auditory Command:  "your life is not good, just do it and run with it" Visual  Suicidal Thoughts:  Yes.  with intent/plan  Homicidal Thoughts:  No  Memory:  Negative  Judgement:  Poor  Insight:  Shallow  Psychomotor Activity:  Decreased  Concentration:  Fair  Recall:  Darren Mendoza:Fair  Language: Fair  Akathisia:  Negative  Handed:  Right  AIMS (if indicated):     Assets:  Communication Skills Desire for Improvement  Sleep:  Musculoskeletal: Strength & Muscle Tone: within normal limits Gait & Station: normal Patient leans: N/A  Past Psychiatric History: Diagnosis:  Hospitalizations:  Outpatient Care:  Substance Abuse Care:  Self-Mutilation:   Suicidal Attempts:  Violent Behaviors:   Past Medical History:   Past Medical History  Diagnosis Date  . Renal disorder   . Kidney stones   . Anxiety   . Depression   . Opiate withdrawal 11/09/2011   None. Allergies:  No Known Allergies PTA Medications: Prescriptions prior to admission  Medication Sig Dispense Refill Last Dose  . [DISCONTINUED] HYDROcodone-acetaminophen (NORCO/VICODIN) 5-325 MG per tablet Take 1-2 tablets by mouth every 6 (six) hours as needed. (Patient not taking: Reported on 08/18/2014) 10 tablet 0   . [DISCONTINUED] naproxen (NAPROSYN) 500 MG tablet Take 1 tablet (500 mg total) by mouth 2 (two) times daily. (Patient not taking: Reported on 08/18/2014) 14 tablet 0     Previous Psychotropic Medications:  Medication/Dose                 Substance Abuse History in the last 12 months:  Yes.    Consequences of Substance Abuse: Withdrawal Symptoms:   Cramps insomnia, seeing spots, hears someone calling his name  Social History:  reports that he has been smoking Cigarettes.  He has a 7.5 pack-year smoking history. His smokeless tobacco use includes Snuff. He reports that he drinks about 3.6 oz of alcohol per week. He reports that he uses illicit drugs (Morphine, Hydrocodone, Hydromorphone, Oxycodone, and Other-see comments). Additional Social History: Pain Medications: reports opiate abuse both "prescription and off the street."  Prescriptions: reports abuse of prescription hydrocodone History of alcohol / drug use?: Yes Longest period of sobriety (when/how long): 3 yrs  Negative Consequences of Use: Financial, Personal relationships, Work / Youth worker Withdrawal Symptoms: Agitation, Cramps, Diarrhea, DTs, Change in blood pressure, Tingling, Tremors, Irritability, Nausea / Vomiting, Anorexia Name of Substance 1: Alcohol  1 - Age of First Use: 58 yrs old  1 - Amount (size/oz): 6 pack or more of beer and a fifth of whiskey 1 - Frequency: daily  1 - Duration: "I  started drinking heavily in 2013" 1 - Last Use / Amount: 08/16/2014 Name of Substance 2: Opiates (Percocets, Vicodin, and Oxymorphone) 2 - Age of First Use: 58 yrs old  2 - Amount (size/oz): 46m-100mg 2 - Frequency: daily  2 - Last Use / Amount: 3 days ago                 Current Place of Residence:   Place of Birth:   Family Members: Marital Status:  Single Children:  Sons:  Daughters: Relationships: Education:  unknown Educational Problems/Performance: Religious Beliefs/Practices: History of Abuse (Emotional/Phsycial/Sexual) Occupational Experiences; Military History:  None. Legal History: Hobbies/Interests:  Family History:   Family History  Problem Relation Age of Onset  . Heart failure Mother   . Anesthesia problems Neg Hx   . Hypotension Neg Hx   . Malignant hyperthermia Neg Hx   . Pseudochol deficiency Neg Hx     Results for orders placed or performed during the hospital encounter of 08/18/14 (from the past 72 hour(s))  Acetaminophen level     Status: None   Collection Time: 08/18/14  9:56 AM  Result Value Ref Range   Acetaminophen (Tylenol), Serum <15.0 10 - 30 ug/mL    Comment:        THERAPEUTIC CONCENTRATIONS VARY SIGNIFICANTLY. A RANGE OF 10-30 ug/mL MAY BE AN EFFECTIVE CONCENTRATION FOR MANY PATIENTS.  HOWEVER, SOME ARE BEST TREATED AT CONCENTRATIONS OUTSIDE THIS RANGE. ACETAMINOPHEN CONCENTRATIONS >150 ug/mL AT 4 HOURS AFTER INGESTION AND >50 ug/mL AT 12 HOURS AFTER INGESTION ARE OFTEN ASSOCIATED WITH TOXIC REACTIONS.   CBC     Status: None   Collection Time: 08/18/14  9:56 AM  Result Value Ref Range   WBC 8.2 4.0 - 10.5 K/uL   RBC 4.88 4.22 - 5.81 MIL/uL   Hemoglobin 14.3 13.0 - 17.0 g/dL   HCT 42.4 39.0 - 52.0 %   MCV 86.9 78.0 - 100.0 fL   MCH 29.3 26.0 - 34.0 pg   MCHC 33.7 30.0 - 36.0 g/dL   RDW 14.3 11.5 - 15.5 %   Platelets 198 150 - 400 K/uL  Comprehensive metabolic panel     Status: Abnormal   Collection Time: 08/18/14   9:56 AM  Result Value Ref Range   Sodium 139 137 - 147 mEq/L   Potassium 3.8 3.7 - 5.3 mEq/L   Chloride 99 96 - 112 mEq/L   CO2 22 19 - 32 mEq/L   Glucose, Bld 147 (H) 70 - 99 mg/dL   BUN 13 6 - 23 mg/dL   Creatinine, Ser 0.68 0.50 - 1.35 mg/dL   Calcium 9.9 8.4 - 10.5 mg/dL   Total Protein 7.9 6.0 - 8.3 g/dL   Albumin 4.4 3.5 - 5.2 g/dL   AST 55 (H) 0 - 37 U/L   ALT 54 (H) 0 - 53 U/L   Alkaline Phosphatase 94 39 - 117 U/L   Total Bilirubin 1.1 0.3 - 1.2 mg/dL   GFR calc non Af Amer >90 >90 mL/min   GFR calc Af Amer >90 >90 mL/min    Comment: (NOTE) The eGFR has been calculated using the CKD EPI equation. This calculation has not been validated in all clinical situations. eGFR's persistently <90 mL/min signify possible Chronic Kidney Disease.    Anion gap 18 (H) 5 - 15  Ethanol (ETOH)     Status: None   Collection Time: 08/18/14  9:56 AM  Result Value Ref Range   Alcohol, Ethyl (B) <11 0 - 11 mg/dL    Comment:        LOWEST DETECTABLE LIMIT FOR SERUM ALCOHOL IS 11 mg/dL FOR MEDICAL PURPOSES ONLY   Salicylate level     Status: Abnormal   Collection Time: 08/18/14  9:56 AM  Result Value Ref Range   Salicylate Lvl <0.1 (L) 2.8 - 20.0 mg/dL  Urine Drug Screen     Status: Abnormal   Collection Time: 08/18/14  9:56 AM  Result Value Ref Range   Opiates POSITIVE (A) NONE DETECTED   Cocaine NONE DETECTED NONE DETECTED   Benzodiazepines NONE DETECTED NONE DETECTED   Amphetamines NONE DETECTED NONE DETECTED   Tetrahydrocannabinol NONE DETECTED NONE DETECTED   Barbiturates NONE DETECTED NONE DETECTED    Comment:        DRUG SCREEN FOR MEDICAL PURPOSES ONLY.  IF CONFIRMATION IS NEEDED FOR ANY PURPOSE, NOTIFY LAB WITHIN 5 DAYS.        LOWEST DETECTABLE LIMITS FOR URINE DRUG SCREEN Drug Class       Cutoff (ng/mL) Amphetamine      1000 Barbiturate      200 Benzodiazepine   093 Tricyclics       235 Opiates          300 Cocaine          300 THC  50     Psychological Evaluations:  Assessment:   DSM5:   Substance/Addictive Disorders:  Alcohol Related Disorder - Severe (303.90), Alcohol Withdrawal (291.81) and Opioid Disorder - Moderate (304.00) Depressive Disorders:  Major Depressive Disorder - Severe (296.23)  AXIS I:  Alcohol Abuse and Major Depression, Recurrent severe AXIS II:  Deferred AXIS III:   Past Medical History  Diagnosis Date  . Renal disorder   . Kidney stones   . Anxiety   . Depression   . Opiate withdrawal 11/09/2011   AXIS IV:  other psychosocial or environmental problems AXIS V:  21-30 behavior considerably influenced by delusions or hallucinations OR serious impairment in judgment, communication OR inability to function in almost all areas  Treatment Plan/Recommendations:  Symptomatic treatment of opiate w/d sxs. Ativan protocol for alcohol w/d sxs. Vistaril PRN for anxiety. Trazodone PRN for insomnia. Will start prozac 10 mg daily for depression/anxiety sxs.  Treatment Plan Summary: Daily contact with patient to assess and evaluate symptoms and progress in treatment Medication management Current Medications:  Current Facility-Administered Medications  Medication Dose Route Frequency Provider Last Rate Last Dose  . acetaminophen (TYLENOL) tablet 650 mg  650 mg Oral Q6H PRN Benjamine Mola, FNP      . alum & mag hydroxide-simeth (MAALOX/MYLANTA) 200-200-20 MG/5ML suspension 30 mL  30 mL Oral Q4H PRN Benjamine Mola, FNP      . cloNIDine (CATAPRES) tablet 0.1 mg  0.1 mg Oral BH-qamhs Benjamine Mola, FNP       Followed by  . [START ON 08/23/2014] cloNIDine (CATAPRES) tablet 0.1 mg  0.1 mg Oral QAC breakfast Benjamine Mola, FNP      . dicyclomine (BENTYL) tablet 20 mg  20 mg Oral Q6H PRN Benjamine Mola, FNP   20 mg at 08/20/14 1359  . [START ON 08/21/2014] FLUoxetine (PROZAC) capsule 10 mg  10 mg Oral Daily  Rene Kocher, MD      . hydrOXYzine (VISTARIL) injection 50 mg  50 mg Intramuscular Q8H PRN Skip Estimable, MD      . loperamide (IMODIUM) capsule 2-4 mg  2-4 mg Oral PRN Benjamine Mola, FNP   2 mg at 08/19/14 1654  . LORazepam (ATIVAN) tablet 1 mg  1 mg Oral QID Ursula Alert, MD   1 mg at 08/20/14 1601   Followed by  . [START ON 08/21/2014] LORazepam (ATIVAN) tablet 1 mg  1 mg Oral TID Ursula Alert, MD       Followed by  . [START ON 08/22/2014] LORazepam (ATIVAN) tablet 1 mg  1 mg Oral BID Ursula Alert, MD       Followed by  . [START ON 08/23/2014] LORazepam (ATIVAN) tablet 1 mg  1 mg Oral Daily Saramma Eappen, MD      . LORazepam (ATIVAN) tablet 1 mg  1 mg Oral Q6H PRN Ursula Alert, MD   1 mg at 08/20/14 1212  . magnesium hydroxide (MILK OF MAGNESIA) suspension 30 mL  30 mL Oral Daily PRN Benjamine Mola, FNP      . methocarbamol (ROBAXIN) tablet 500 mg  500 mg Oral Q8H PRN Benjamine Mola, FNP   500 mg at 08/20/14 1601  . multivitamin with minerals tablet 1 tablet  1 tablet Oral Daily Ursula Alert, MD   1 tablet at 08/20/14 0836  . naproxen (NAPROSYN) tablet 500 mg  500 mg Oral BID PRN Benjamine Mola, FNP   500 mg at 08/20/14 0932  . ondansetron (  ZOFRAN-ODT) disintegrating tablet 4 mg  4 mg Oral Q6H PRN Benjamine Mola, FNP   4 mg at 08/19/14 1761  . thiamine (VITAMIN B-1) tablet 100 mg  100 mg Oral Daily Ursula Alert, MD   100 mg at 08/20/14 0836  . traZODone (DESYREL) tablet 50 mg  50 mg Oral QHS PRN Skip Estimable, MD        Observation Level/Precautions:  Detox 15 minute checks  Laboratory:  UDS +opiates. BAL negative  Psychotherapy:    Medications:    Consultations:    Discharge Concerns:    Estimated LOS:  Other:     I certify that inpatient services furnished can reasonably be expected to improve the patient's condition.   ,  12/5/20155:14 PM

## 2014-08-21 DIAGNOSIS — F10239 Alcohol dependence with withdrawal, unspecified: Secondary | ICD-10-CM

## 2014-08-21 DIAGNOSIS — F1190 Opioid use, unspecified, uncomplicated: Secondary | ICD-10-CM

## 2014-08-21 MED ORDER — TRAZODONE HCL 100 MG PO TABS
100.0000 mg | ORAL_TABLET | Freq: Every evening | ORAL | Status: DC | PRN
  Administered 2014-08-21: 100 mg via ORAL
  Filled 2014-08-21: qty 1

## 2014-08-21 MED ORDER — GABAPENTIN 100 MG PO CAPS
100.0000 mg | ORAL_CAPSULE | Freq: Three times a day (TID) | ORAL | Status: DC
  Administered 2014-08-21 – 2014-08-22 (×2): 100 mg via ORAL
  Filled 2014-08-21 (×7): qty 1

## 2014-08-21 NOTE — Progress Notes (Signed)
D Pt. Is tearful this pm. Reporting that he is concerned about upcoming discharge.  Pt. Reports passive SI d/t the unknown of where he will go. No complaints of pain or discomfort not at this time. Pt. Is rating his depression and anxiety at a 10 this pm.  A Writer offered support and encouragement,  Discussed facilities that pt. Has attended in the past.  R Pt. Remains safe on the unit.  Pt. Verbally contracts for safety, but reports if he is discharged without somewhere to go he will hurt self.  Pt. Reports  [facility]  At which time he remained sober for appr. 4 years.  Writer assured patient she would pass this information on in her report. This seemed to calm the pt., the tears were cleared and he went to his room to rest.

## 2014-08-21 NOTE — Progress Notes (Signed)
Did not attended group 

## 2014-08-21 NOTE — Progress Notes (Signed)
Patient ID: Darren Mendoza, male   DOB: 11/07/1955, 58 y.o.   MRN: [id number] D. Pt presents with depressed mood, affect anxious again today. [name] continues to report he is having various withdrawal symptoms including anxiety, shakes, auditory hallucinations ''seeing shadows and hearing voices call my name.''  and stomach cramps. [name] states ''i want long term treatment. I can't do this alone. I'm not sleeping and i need something else to help me i feel like i'm going crazy'' Patient completed self inventory and writes '' if I get kicked out of here I don't know what I will do I will kill myself. He rates his depression and anxiety at 9/10 on depression scale, 10 being worst, 1 being least. '' He denies any SI currently , and is able to contract for safety. A. Discussed above information with Dr. Tawni CarnesSaranga. Medications given as ordered including several prn medications for withdrawal. Support and encouragement provided. R. Pt is safe, in no acute distress at this time. Will continue to monitor q 15 minutes for safety.

## 2014-08-21 NOTE — [facility] Group Notes (Signed)
[name] Group Notes:  (Nursing/MHT/Case Management/Adjunct)  Date:  08/21/2014  Time:  9:45am  Type of Therapy:  Nurse Education - Healthy Support Systems  Participation Level:  Active  Participation Quality:  Attentive  Affect:  Depressed  Cognitive:  Oriented  Insight:  Improving  Engagement in Group:  Engaged  Modes of Intervention:  Discussion, Education and Support  Summary of Progress/Problems: Patient was assisted in working through his [employer] workbook, especially with regards to how it applies to his life.  Lawrence MarseillesFriedman, Paymon Rosensteel Eakes 08/21/2014, 2:49 PM

## 2014-08-21 NOTE — [facility] Group Notes (Signed)
[name] Group Notes:  (Nursing/MHT/Case Management/Adjunct)  Date:  08/21/2014  Time:  9:30am  Type of Therapy:  Nurse Education - Patient Self Inventory  Participation Level:  Active  Participation Quality:  Attentive  Affect:  Depressed  Cognitive:  Oriented  Insight:  Good  Engagement in Group:  Engaged  Modes of Intervention:  Discussion, Education and Support  Summary of Progress/Problems: Patient states he did not sleep well and that his "meds are doing nothing for me." "I have voices calling my name and I see spots."  Darren Mendoza, Darren Mendoza 08/21/2014, 2:47 PM

## 2014-08-21 NOTE — Progress Notes (Signed)
[facility]  08/21/2014 3:43 PM Darren Mendoza  MRN:  [id number] Subjective:  Chart reviewed. Pt interviewed. Case discussed with nursing staff. Pt continues to report hearing a voice saying his name "Tommy", and states he is "seeing spots". Pt reports feeling the "same" as yesterday. Pt reports feeling "jittery", but no significant hand tremor noted on exam. Pt denies SI/HI. Pt denies side effects of meds. Poor sleep last night. Appetite better. Diagnosis:   DSM5:  Substance/Addictive Disorders:  Alcohol Related Disorder - Severe (303.90), Alcohol Withdrawal (291.81) and Opioid Disorder - Severe (304.00) Depressive Disorders:  Major Depressive Disorder - Severe (296.23) Total Time spent with patient: 30 minutes  ADL's:  Intact  Sleep: Poor  Appetite:  Fair  Suicidal Ideation: Pt denies.  Homicidal Ideation: Pt denies.  AEB (as evidenced by):  Psychiatric Specialty Exam: Physical Exam  ROS  Blood pressure 135/81, pulse 66, temperature 97.1 F (36.2 C), temperature source Oral, resp. rate 18, height 5' 8.31" (1.735 m), weight 105.235 kg (232 lb), SpO2 97 %.Body mass index is 34.96 kg/(m^2).  General Appearance: Disheveled  Eye SolicitorContact::  Fair  Speech:  Normal Rate  Volume:  Normal  Mood:  Depressed and anxious  Affect:  Congruent and Depressed  Thought Process:  Goal Directed  Orientation:  Full (Time, Place, and Person)  Thought Content:  Hallucinations: Auditory Visual  Suicidal Thoughts:  No  Homicidal Thoughts:  No  Memory:  Negative  Judgement:  Impaired  Insight:  Lacking  Psychomotor Activity:  Normal  Concentration:  Fair  Recall:  Fair  Fund of Knowledge:Fair  Language: Fair  Akathisia:  Negative  Handed:  Right  AIMS (if indicated):     Assets:  Communication Skills Desire for Improvement  Sleep:  Number of Hours: 6.5   Musculoskeletal: Strength & Muscle Tone: within normal limits Gait & Station: normal Patient leans: N/A  Current  Medications: Current Facility-Administered Medications  Medication Dose Route Frequency Provider Last Rate Last Dose  . acetaminophen (TYLENOL) tablet 650 mg  650 mg Oral Q6H PRN Beau FannyJohn C Withrow, FNP   650 mg at 08/20/14 1727  . alum & mag hydroxide-simeth (MAALOX/MYLANTA) 200-200-20 MG/5ML suspension 30 mL  30 mL Oral Q4H PRN Beau FannyJohn C Withrow, FNP      . cloNIDine (CATAPRES) tablet 0.1 mg  0.1 mg Oral BH-qamhs Beau FannyJohn C Withrow, FNP   0.1 mg at 08/21/14 0813   Followed by  . [START ON 08/23/2014] cloNIDine (CATAPRES) tablet 0.1 mg  0.1 mg Oral QAC breakfast Beau FannyJohn C Withrow, FNP      . dicyclomine (BENTYL) tablet 20 mg  20 mg Oral Q6H PRN Beau FannyJohn C Withrow, FNP   20 mg at 08/21/14 0816  . FLUoxetine (PROZAC) capsule 10 mg  10 mg Oral Daily Caprice KluverVinay P Dayle Sherpa, MD   10 mg at 08/21/14 0813  . hydrOXYzine (VISTARIL) injection 50 mg  50 mg Intramuscular Q8H PRN Caprice KluverVinay P Edmar Blankenburg, MD      . loperamide (IMODIUM) capsule 2-4 mg  2-4 mg Oral PRN Beau FannyJohn C Withrow, FNP   2 mg at 08/19/14 1654  . LORazepam (ATIVAN) tablet 1 mg  1 mg Oral TID Jomarie LongsSaramma Eappen, MD   1 mg at 08/21/14 1153   Followed by  . [START ON 08/22/2014] LORazepam (ATIVAN) tablet 1 mg  1 mg Oral BID Jomarie LongsSaramma Eappen, MD       Followed by  . [START ON 08/23/2014] LORazepam (ATIVAN) tablet 1 mg  1 mg Oral  Daily Jomarie LongsSaramma Eappen, MD      . LORazepam (ATIVAN) tablet 1 mg  1 mg Oral Q6H PRN Jomarie LongsSaramma Eappen, MD   1 mg at 08/20/14 2244  . magnesium hydroxide (MILK OF MAGNESIA) suspension 30 mL  30 mL Oral Daily PRN Beau FannyJohn C Withrow, FNP      . methocarbamol (ROBAXIN) tablet 500 mg  500 mg Oral Q8H PRN Beau FannyJohn C Withrow, FNP   500 mg at 08/21/14 [id number]  . multivitamin with minerals tablet 1 tablet  1 tablet Oral Daily Jomarie LongsSaramma Eappen, MD   1 tablet at 08/21/14 0813  . naproxen (NAPROSYN) tablet 500 mg  500 mg Oral BID PRN Beau FannyJohn C Withrow, FNP   500 mg at 08/21/14 0816  . ondansetron (ZOFRAN-ODT) disintegrating tablet 4 mg  4 mg Oral Q6H PRN Beau FannyJohn C Withrow, FNP   4 mg at 08/19/14  [id number]  . thiamine (VITAMIN B-1) tablet 100 mg  100 mg Oral Daily Jomarie LongsSaramma Eappen, MD   100 mg at 08/21/14 0814  . traZODone (DESYREL) tablet 50 mg  50 mg Oral QHS PRN Caprice KluverVinay P Eliel Dudding, MD   50 mg at 08/20/14 2108    Lab Results: No results found for this or any previous visit (from the past 48 hour(s)).  Physical Findings: AIMS: Facial and Oral Movements Muscles of Facial Expression: None, normal Lips and Perioral Area: None, normal Jaw: None, normal Tongue: None, normal,Extremity Movements Upper (arms, wrists, hands, fingers): None, normal Lower (legs, knees, ankles, toes): None, normal, Trunk Movements Neck, shoulders, hips: None, normal, Overall Severity Severity of abnormal movements (highest score from questions above): None, normal Incapacitation due to abnormal movements: None, normal Patient's awareness of abnormal movements (rate only patient's report): No Awareness, Dental Status Current problems with teeth and/or dentures?: No Does patient usually wear dentures?: No  CIWA:  CIWA-Ar Total: 8 COWS:  COWS Total Score: 4  Treatment Plan Summary: Daily contact with patient to assess and evaluate symptoms and progress in treatment Medication management continue prozac for depression/anxiety. continue ativan taper for alcohol withdrawal sxs. Will start neurontin for anxiety sxs. Plan:  Medical Decision Making Problem Points:  Established problem, stable/improving (1) and Review of last therapy session (1) Data Points:  Review and summation of old records (2) Review of medication regiment & side effects (2)  I certify that inpatient services furnished can reasonably be expected to improve the patient's condition.   Ancil LinseySARANGA, Darren Mendoza 08/21/2014, 3:43 PM

## 2014-08-21 NOTE — [facility] Group Notes (Signed)
[name] LCSW Group Therapy 08/21/2014 1:15pm  Type of Therapy: Group Therapy- Feelings Around Discharge & Establishing a Supportive Framework  Participation Level: Minimal  Modes of Intervention: Clarification, Confrontation, Discussion, Education, Exploration, Limit-setting, Orientation, Problem-solving, Rapport Building, Dance movement psychotherapisteality Testing, Socialization and Support   Description of Group:   What is a supportive framework? What does it look like feel like and how do I discern it from and unhealthy non-supportive network? Learn how to cope when supports are not helpful and don't support you. Discuss what to do when your family/friends are not supportive.  Summary of Patient Progress Pt did not participate in group discussion. Pt arrived late and stood at the back of the room.   Therapeutic Modalities:   Cognitive Behavioral Therapy Person-Centered Therapy Motivational Interviewing   Chad CordialLauren Carter, LCSWA 08/21/2014 2:49 PM

## 2014-08-22 DIAGNOSIS — F11222 Opioid dependence with intoxication with perceptual disturbance: Secondary | ICD-10-CM

## 2014-08-22 DIAGNOSIS — F10230 Alcohol dependence with withdrawal, uncomplicated: Secondary | ICD-10-CM

## 2014-08-22 DIAGNOSIS — F1020 Alcohol dependence, uncomplicated: Secondary | ICD-10-CM

## 2014-08-22 DIAGNOSIS — F10932 Alcohol use, unspecified with withdrawal with perceptual disturbance: Secondary | ICD-10-CM

## 2014-08-22 DIAGNOSIS — F10232 Alcohol dependence with withdrawal with perceptual disturbance: Principal | ICD-10-CM

## 2014-08-22 DIAGNOSIS — F1123 Opioid dependence with withdrawal: Secondary | ICD-10-CM

## 2014-08-22 DIAGNOSIS — F1120 Opioid dependence, uncomplicated: Secondary | ICD-10-CM

## 2014-08-22 MED ORDER — FLUOXETINE HCL 20 MG PO CAPS
20.0000 mg | ORAL_CAPSULE | Freq: Every day | ORAL | Status: DC
  Administered 2014-08-23: 20 mg via ORAL
  Filled 2014-08-22 (×2): qty 1

## 2014-08-22 MED ORDER — TRAZODONE HCL 150 MG PO TABS
150.0000 mg | ORAL_TABLET | Freq: Every day | ORAL | Status: DC
  Administered 2014-08-22: 150 mg via ORAL
  Filled 2014-08-22 (×2): qty 1

## 2014-08-22 MED ORDER — RISPERIDONE 0.5 MG PO TBDP
0.5000 mg | ORAL_TABLET | Freq: Every day | ORAL | Status: DC
  Administered 2014-08-22: 0.5 mg via ORAL
  Filled 2014-08-22 (×2): qty 1

## 2014-08-22 MED ORDER — ENSURE COMPLETE PO LIQD
237.0000 mL | Freq: Two times a day (BID) | ORAL | Status: DC
  Administered 2014-08-22 – 2014-08-24 (×6): 237 mL via ORAL

## 2014-08-22 MED ORDER — GABAPENTIN 100 MG PO CAPS
200.0000 mg | ORAL_CAPSULE | Freq: Three times a day (TID) | ORAL | Status: DC
  Administered 2014-08-22 – 2014-08-24 (×7): 200 mg via ORAL
  Filled 2014-08-22 (×5): qty 2
  Filled 2014-08-22 (×2): qty 112
  Filled 2014-08-22 (×6): qty 2
  Filled 2014-08-22: qty 112

## 2014-08-22 MED ORDER — BENZTROPINE MESYLATE 0.5 MG PO TABS
0.5000 mg | ORAL_TABLET | Freq: Every day | ORAL | Status: DC
  Administered 2014-08-22: 0.5 mg via ORAL
  Filled 2014-08-22 (×2): qty 1

## 2014-08-22 NOTE — [facility] Group Notes (Signed)
[facility]   08/22/2014 11:04 AM  Participation Quality:  Appropriate   Mood/Affect:  Depressed and Flat  Depression Rating:  10  Anxiety Rating:  10  Thoughts of Suicide:  No Will you contract for safety?   NA  Current AVH:  No  Plan for Discharge/Comments:  Pt reports that he has been an alcoholic and drug addict for several years and relapsed recently due to several life stressors. Pt reports poor sleep and moderate withdrawals (shakes). Pt requesting referral to [facility] as plan B and signed release for [facility]. CSW assessing.   Transportation Means: unknown at this time   Supports: aunt and uncle   Smart, Lebron QuamHeather LCSWA

## 2014-08-22 NOTE — Progress Notes (Signed)
Patient ID: Darren Mendoza, male   DOB: 05-Jul-1956, 58 y.o.   MRN: [id number]  DAR: Pt. Denies SI/HI and A/V Hallucinations to this writer during morning assessment. Patient reports that he slept very poor last night but cannot tell writer what specifically is causing his poor sleep. Patient does not report any extreme withdrawal symptoms to this Clinical research associatewriter. Patient's affect is anxious and mood depressed. Patient is worrying about his follow up. Patient smiles inappropriately at medication window after reporting that he is depressed. Patient does not report any pain or discomfort at this time. Support and encouragement provided to the patient. Patient appears preoccupied with snack time over going to group however patient did attend group this morning. Patient is able to voice his needs as appropriate. Q15 minute checks are maintained for safety.

## 2014-08-22 NOTE — Progress Notes (Signed)
The focus of this group is to help patients review their daily goal of treatment and discuss progress on daily workbooks. Pt attended the evening group session and responded to all discussion prompts from the Writer. Pt shared that today was a good day, that he was "thankful to have woken up and have breath in my body." [name] told the group that he was hoping to get back into long-term care upon discharge. "It's a lot harder than you might think transitioning out of that into the real world. I do good in that environment." Pt's only additional request from Nursing Staff was to wash his clothes, which was taken care of following wrap-up. Pt's affect was appropriate.

## 2014-08-22 NOTE — Progress Notes (Signed)
[facility] MD Progress Note  08/22/2014 11:02 AM DREVIN ORTNER  MRN:  [id number] Subjective:  Patient states," I need help with my alcohol and opiate abuse. I have been having trouble with my life 2/2 my alcohol/opiate abuse. I lost my job ,I lost my family, I need help."  Objective : Chart reviewed. Pt interviewed. Case discussed with nursing staff. Pt very tearful this AM ,broke down crying during the evaluation requesting help with his substance abuse. Pt continues to report hearing a voice saying his name "Darren Mendoza", and states he is "seeing something like when you light a match stick"". Pt reports feeling the "same" as yesterday. Pt reports feeling "jittery", but no significant hand tremor noted on exam. Pt denies SI/HI. Pt denies side effects of meds. Poor sleep last night. Appetite is improving.    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   Non Psychiatric Diagnosis: Please see pmh    Total Time spent with patient: 30 minutes  ADL's:  Intact  Sleep: Poor  Appetite:  Fair  Psychiatric Specialty Exam: Physical Exam  ROS  Blood pressure 116/88, pulse 62, temperature 97.5 F (36.4 C), temperature source Oral, resp. rate 20, height 5' 8.31" (1.735 m), weight 105.235 kg (232 lb), SpO2 97 %.Body mass index is 34.96 kg/(m^2).  General Appearance: Fairly Groomed  Patent attorney::  Fair  Speech:  Normal Rate  Volume:  Normal  Mood:  Depressed and anxious  Affect:  Depressed and Tearful  Thought Process:  Goal Directed  Orientation:  Full (Time, Place, and Person)  Thought Content:  Hallucinations: Auditory Visual hearing some one call his name as well as  seeing flashes  Suicidal Thoughts:  No  Homicidal Thoughts:  No  Memory:  Negative  Judgement:  Impaired  Insight:  Fair  Psychomotor Activity:  Normal  Concentration:  Fair  Recall:  [employer] of Knowledge:Fair  Language: Fair  Akathisia:  Negative  Handed:  Right  AIMS (if indicated):     Assets:  Communication Skills Desire for Improvement  Sleep:  Number of Hours: 5   Musculoskeletal: Strength & Muscle Tone: within normal limits Gait & Station: normal Patient leans: N/A  Current Medications: Current Facility-Administered Medications  Medication Dose Route Frequency Provider Last Rate Last Dose  . acetaminophen (TYLENOL) tablet 650 mg  650 mg Oral Q6H PRN Beau Fanny, FNP   650 mg at 08/20/14 1727  . alum & mag hydroxide-simeth (MAALOX/MYLANTA) 200-200-20 MG/5ML suspension 30 mL  30 mL Oral Q4H PRN Beau Fanny, FNP      . risperiDONE (RISPERDAL M-TABS) disintegrating tablet 0.5 mg  0.5 mg Oral QHS Yaakov Saindon, MD       And  . benztropine (COGENTIN) tablet 0.5 mg  0.5 mg Oral QHS Kellan Boehlke, MD      . Melene Muller ON 08/23/2014] cloNIDine (CATAPRES) tablet 0.1 mg  0.1 mg Oral QAC breakfast Beau Fanny, FNP      . dicyclomine (BENTYL) tablet 20 mg  20 mg Oral Q6H PRN Beau Fanny, FNP   20 mg at 08/21/14 0816  . feeding supplement (ENSURE COMPLETE) (ENSURE COMPLETE) liquid 237 mL  237 mL Oral BID BM Tilda Franco, RD   237 mL at 08/22/14 1012  . [START ON 08/23/2014] FLUoxetine (  PROZAC) capsule 20 mg  20 mg Oral Daily Angeni Chaudhuri, MD      . gabapentin (NEURONTIN) capsule 200 mg  200 mg Oral TID Jomarie LongsSaramma Nattie Lazenby, MD      . hydrOXYzine (VISTARIL) injection 50 mg  50 mg Intramuscular Q8H PRN Caprice KluverVinay P Saranga, MD      . loperamide (IMODIUM) capsule 2-4 mg  2-4 mg Oral PRN Beau FannyJohn C Withrow, FNP   2 mg at 08/19/14 1654  . LORazepam (ATIVAN) tablet 1 mg  1 mg Oral BID Jomarie LongsSaramma Josslin Sanjuan, MD   1 mg at 08/22/14 0814   Followed by  . [START ON 08/23/2014] LORazepam (ATIVAN) tablet  1 mg  1 mg Oral Daily Crit Obremski, MD      . LORazepam (ATIVAN) tablet 1 mg  1 mg Oral Q6H PRN Jomarie LongsSaramma Savio Albrecht, MD   1 mg at 08/21/14 2317  . magnesium hydroxide (MILK OF MAGNESIA) suspension 30 mL  30 mL Oral Daily PRN Beau FannyJohn C Withrow, FNP      . methocarbamol (ROBAXIN) tablet 500 mg  500 mg Oral Q8H PRN Beau FannyJohn C Withrow, FNP   500 mg at 08/21/14 [id number]  . multivitamin with minerals tablet 1 tablet  1 tablet Oral Daily Jomarie LongsSaramma Bonny Egger, MD   1 tablet at 08/22/14 0814  . naproxen (NAPROSYN) tablet 500 mg  500 mg Oral BID PRN Beau FannyJohn C Withrow, FNP   500 mg at 08/21/14 0816  . ondansetron (ZOFRAN-ODT) disintegrating tablet 4 mg  4 mg Oral Q6H PRN Beau FannyJohn C Withrow, FNP   4 mg at 08/19/14 [id number]  . thiamine (VITAMIN B-1) tablet 100 mg  100 mg Oral Daily Jomarie LongsSaramma Sidney Kann, MD   100 mg at 08/22/14 0814  . traZODone (DESYREL) tablet 150 mg  150 mg Oral QHS Jomarie LongsSaramma Lidiya Reise, MD        Lab Results: No results found for this or any previous visit (from the past 48 hour(s)).  Physical Findings: AIMS: Facial and Oral Movements Muscles of Facial Expression: None, normal Lips and Perioral Area: None, normal Jaw: None, normal Tongue: None, normal,Extremity Movements Upper (arms, wrists, hands, fingers): None, normal Lower (legs, knees, ankles, toes): None, normal, Trunk Movements Neck, shoulders, hips: None, normal, Overall Severity Severity of abnormal movements (highest score from questions above): None, normal Incapacitation due to abnormal movements: None, normal Patient's awareness of abnormal movements (rate only patient's report): No Awareness, Dental Status Current problems with teeth and/or dentures?: No Does patient usually wear dentures?: No  CIWA:  CIWA-Ar Total: 4 COWS:  COWS Total Score: 0  Treatment Plan Summary: Daily contact with patient to assess and evaluate symptoms and progress in treatment Medication management   Plan: Continue Clonidine /cows protocol. Continue CIWA/Ativan protocol.  Patient with significant hx of alcohol abuse/DTs in the past.  Will increase Prozac to 20 mg po daily for affective sx. His affective sx could be 2/2 his substance abuse. Will start a small dose of Risperdal 0.5 mg po qhs for hallucination ,likely 2/2 to withdrawal . Will increase Trazodone to 150 mg po qhs for sleep issues. Will increase Neurontin to 200 mg po tid.  CSW will work on disposition. Patient to be referred to a substance abuse program ,possibly inpatient. Patient to participate in therapeutic milieu.   Medical Decision Making Problem Points:  Established problem, worsening (2), Review of last therapy session (1) and Review of psycho-social stressors (1) Data Points:  Review and summation of old records (2) Review of medication regiment & side effects (  2)  I certify that inpatient services furnished can reasonably be expected to improve the patient's condition.   Elizabelle Fite MD 08/22/2014, 11:02 AM

## 2014-08-22 NOTE — Clinical Social Work Note (Signed)
Per pt request, [facility]  [facility] Smart, LCSWA 08/22/2014 3:02 PM

## 2014-08-22 NOTE — Tx Team (Signed)
Interdisciplinary Treatment Plan Update (Adult)   Date: 08/22/2014   Time Reviewed:9:30AM Progress in Treatment:  Attending groups: Yes  Participating in groups:  Yes Taking medication as prescribed: Yes  Tolerating medication: Yes  Family/Significant othe contact made: Not yet. SPE required for this pt.  Patient understands diagnosis: Yes, AEB seeking treatment for ETOH detox, polysubstance abuse, AH, SI upon admission, mood instability, and for medication management.  Discussing patient identified problems/goals with staff: Yes  Medical problems stabilized or resolved: Yes  Denies suicidal/homicidal ideation: Yes during group/self report.  Patient has not harmed self or Others: Yes  New problem(s) identified:  Discharge Plan or Barriers: Pt requesting referral to [facility] and [facility] (plan B). [facility] after treatment and signed release. CSW assessing.  Additional comments:  [name] is a 58 year old caucasian male w with hx of depression, alcoholism, and opiate withdrawal. [facility]. Sts that he drinks heavily. Patient reports drinking daily since 2013. Patient drinks a 6 pack of beer and a fifth of whiskey daily. Patient's last drink was 2 days ago. He also reports opiate use 50mg -100mg 's per day. His last use was also 2 days ago. Patient reports several withdrawal symptoms including chills, nausea, fine hand tremors, and body shakes. Patient reports that he uses to function rather than to achieve a high. Patient is tearful throughout the assessment reporting that he has been feeling hopeless and he needs help patient endorses suicidal ideation with a plan to slit his writs. Patient has a long history of mental illness and substance abuse and dependence. He was last hospitalized a year ago at the behavioral health hospital and has been to several substance recovery programs the last being 4 years  ago. Patient is not currently on any psychotropic medications.pt reports drinking 6 pack per day to 1/5 of liquor per day. Pt has been using percocet, vicodin, other opiates. [facility] Current withdrawal sxs include cramps, seeing spots, insomnia, hearing someone calling his name. Mood is "down". Poor appetite (lost 35 lbs over 3 months). Pt reports on the way to the hospital having suicidal ideation with a plan to cut his wrists or walk into traffic. No HI. He reports AH telling him "your life is no good, just do it and run with it". +anxiety/fear/worry. No previous psychotropic meds. [facility]   Reason for Continuation of Hospitalization: Clonidine/Ativan taper-withdrawals Mood stabilization Medication management Estimated length of stay: 3-5 days  For review of initial/current patient goals, please see plan of care.  Attendees:  Patient:    Family:    Physician: Dr. Elna BreslowEappen MD 08/22/2014   Nursing: Darren GreaserBrittany, Darren Mendoza, Foy GuadalajaraChrista RN 08/22/2014   Clinical Social Worker Darren Mendoza, LCSWA  08/22/2014   Other: Darren Itood North, LCSW 08/22/2014   Other: Darren DatesJennifer C. Nurse CM 08/22/2014   Other: Darren Badeolora Mendoza, Community Care Coordinator  08/22/2014   Other:    Scribe for Treatment Team:  Darren SladeHeather Mendoza LCSWA 08/22/2014

## 2014-08-22 NOTE — Plan of Care (Signed)
Problem: Alteration in mood & ability to function due to Goal: STG-Patient will comply with prescribed medication regimen (Patient will comply with prescribed medication regimen)  Outcome: Completed/Met Date Met:  08/22/14 Patient compliant with medication regimen.

## 2014-08-22 NOTE — Progress Notes (Signed)
NUTRITION ASSESSMENT  Pt identified as at risk on the Malnutrition Screen Tool  INTERVENTION: 1. Educated patient on the importance of nutrition and encouraged intake of food and beverages. 2. Discussed weight goals. 3. Supplements: Ensure Complete po BID, each supplement provides 350 kcal and 13 grams of protein  NUTRITION DIAGNOSIS: Unintentional weight loss related to sub-optimal intake as evidenced by pt report.   Goal: Pt to meet >/= 90% of their estimated nutrition needs.  Monitor:  PO intake  Assessment:  Pt admitted with depression, anxiety, ETOH abuse and opiate use.  Pt reports UBW of 270 lb, pt with 28 lb weight loss over 4-5 months (11% wt loss x 4-5 months, significant for time frame). Pt states that he was not eating well PTA. Pt has had decreased appetite. Pt would benefit from nutritional supplement, RD to order Ensure BID.  Height: Ht Readings from Last 1 Encounters:  08/18/14 5' 8.31" (1.735 m)    Weight: Wt Readings from Last 1 Encounters:  08/18/14 232 lb (105.235 kg)    Weight Hx: Wt Readings from Last 10 Encounters:  08/18/14 232 lb (105.235 kg)  08/15/14 260 lb (117.935 kg)  12/06/11 240 lb (108.863 kg)  11/06/11 225 lb 12.8 oz (102.422 kg)  09/10/11 250 lb (113.399 kg)    BMI:  Body mass index is 34.96 kg/(m^2). Pt meets criteria for obesity based on current BMI.  Estimated Nutritional Needs: Kcal: 25-30 kcal/kg Protein: > 1 gram protein/kg Fluid: 1 ml/kcal  Diet Order: Diet regular Pt is also offered choice of unit snacks mid-morning and mid-afternoon.  Pt is eating as desired.   Lab results and medications reviewed.   Tilda FrancoLindsey Daisey Caloca, MS, RD, LDN Pager: [phone] After Hours Pager: [phone]

## 2014-08-22 NOTE — [facility] Group Notes (Signed)
[name] LCSW Group Therapy  08/22/2014 1:46 PM  Type of Therapy:  Group Therapy  Participation Level:  Active  Participation Quality:  Attentive  Affect:  Depressed and Flat  Cognitive:  Alert  Insight:  Engaged  Engagement in Therapy:  Improving  Modes of Intervention:  Discussion, Education, Exploration, Limit-setting, Problem-solving, Rapport Building, Socialization and Support  Summary of Progress/Problems:  Today's Topic: Community. Group members were asked to identify what "community" means to them, what values a community shares, and examples of where they felt a sense of community. Group members were challenged to process how a community functions to support its members and identify how the positive and negative aspects of a community impact the individual. Darren Mendoza shared that to him, community means 'serenity.' He identified feeling like a member of a community when inpatient in treatment but stated that he has never felt a sense of community when not in rehab. "I don't have any good supports out in the world. I feel safest in treatment." Darren Mendoza shared that he stays away from others in the community when not in treatment. He continues to present with depressed mood with irritable affect.   Smart, Ursula Dermody LCSWA 08/22/2014, 1:46 PM

## 2014-08-23 DIAGNOSIS — F1994 Other psychoactive substance use, unspecified with psychoactive substance-induced mood disorder: Secondary | ICD-10-CM

## 2014-08-23 MED ORDER — GABAPENTIN 100 MG PO CAPS
200.0000 mg | ORAL_CAPSULE | Freq: Every day | ORAL | Status: DC
  Administered 2014-08-23: 200 mg via ORAL
  Filled 2014-08-23 (×3): qty 2

## 2014-08-23 MED ORDER — FLUOXETINE HCL 20 MG PO CAPS
40.0000 mg | ORAL_CAPSULE | Freq: Every day | ORAL | Status: DC
  Administered 2014-08-24: 40 mg via ORAL
  Filled 2014-08-23: qty 28
  Filled 2014-08-23 (×3): qty 2

## 2014-08-23 MED ORDER — TRAZODONE HCL 100 MG PO TABS
200.0000 mg | ORAL_TABLET | Freq: Every day | ORAL | Status: DC
  Administered 2014-08-23: 200 mg via ORAL
  Filled 2014-08-23 (×4): qty 2

## 2014-08-23 MED ORDER — BENZTROPINE MESYLATE 0.5 MG PO TABS
0.5000 mg | ORAL_TABLET | Freq: Every day | ORAL | Status: DC
  Administered 2014-08-23: 0.5 mg via ORAL
  Filled 2014-08-23 (×3): qty 1
  Filled 2014-08-23: qty 14

## 2014-08-23 MED ORDER — RISPERIDONE 1 MG PO TBDP
1.0000 mg | ORAL_TABLET | Freq: Every day | ORAL | Status: DC
  Administered 2014-08-23: 1 mg via ORAL
  Filled 2014-08-23: qty 1
  Filled 2014-08-23: qty 14
  Filled 2014-08-23 (×2): qty 1

## 2014-08-23 NOTE — [facility] Group Notes (Signed)
[name] Group Notes:  (Nursing/MHT/Case Management/Adjunct)  Date:  08/23/2014  Time:  12:25 PM  Type of Therapy:  Nurse Education  Participation Level:  Minimal  Participation Quality:  Appropriate  Affect:  Appropriate  Cognitive:  Appropriate  Insight:  Lacking  Engagement in Group:  Engaged  Modes of Intervention:  Discussion and Education  Summary of Progress/Problems:  The group topic was recovery. Staff discussed with patient's about what recovery means and coping skills to continue on. Patient came to group late. Patient was engaged but did not speak during group.   Marzetta BoardDopson, Ekam Besson E 08/23/2014, 12:25 PM

## 2014-08-23 NOTE — [facility] Group Notes (Signed)
[name] Group Notes:  (Counselor/Nursing/MHT/Case Management/Adjunct)  08/23/2014 1:15PM  Type of Therapy:  Group Therapy  Participation Level:  Active  Participation Quality:  Appropriate  Affect:  Flat  Cognitive:  Oriented  Insight:  Improving  Engagement in Group:  Limited  Engagement in Therapy:  Limited  Modes of Intervention:  Discussion, Exploration and Socialization  Summary of Progress/Problems: The topic for group was balance in life.  Pt participated in the discussion about when their life was in balance and out of balance and how this feels.  Pt discussed ways to get back in balance and short term goals they can work on to get where they want to be. Darren Mendoza states he bottles up his emotions because he has never had a role model.  He eventually explodes.  "I've missed out on so many opportunities."  Named multiple things.  Also talked about substance abuse for coping that "only gets me into trouble, both legal and mental health wise."  In and out of group several times.   Daryel Geraldorth, Conrado Nance B 08/23/2014 11:31 AM

## 2014-08-23 NOTE — Progress Notes (Signed)
Patient ID: Darren Mendoza, male   DOB: Feb 13, 1956, 58 y.o.   MRN: [id number]  D: Pt. Denies SI/HI and A/V Hallucinations to this writer during morning assessment. Patient appears to still be preoccupied about the lack of sleeping he is having. 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 Patient [facility] that [facility]   A: Support and encouragement provided to the patient. Patient is able to voice his questions and concerns. Patient's medications administered to him as scheduled.  R: Patient is receptive and cooperative but can be attention seeking at times. Patient was seen earlier in the hall with his shirt unbuttoned. Patient was reminded that he needs to be milieu appropriate. Patient was seen with his shirt buttoned on writer's next approach. Q15 minute checks are maintained for safety.

## 2014-08-23 NOTE — Plan of Care (Signed)
Problem: Ineffective individual coping Goal: STG-Increase in ability to manage activities of daily living Outcome: Completed/Met Date Met:  08/23/14 Patient able to take care of all his ADL's

## 2014-08-23 NOTE — Progress Notes (Signed)
Patient ID: Darren Mendoza, male   DOB: 06/18/1956, 58 y.o.   MRN: [id number] D: Patient alert and cooperative. Pt reports he had a good day. Pt mood/affect is depressed and flat. pt denies any withdrawal symptoms. Pt endorses passive SI without a plan and verbally contract to come to staff if feeling unsafe. Pt denies HI/AVH. No acute distressed noted at this time.   A: Medications administered as prescribed. Emotional support given and will continue to monitor pt's progress for stabilization.  R: Patient is safe and complaint with medications.

## 2014-08-23 NOTE — Progress Notes (Addendum)
[facility]  08/23/2014 11:19 AM Darren Mendoza  MRN:  [id number] Subjective:  Patient states," I am not sleeping at all , I always had sleep problems."  Objective : Chart reviewed. Pt interviewed. Case discussed with nursing staff. Pt today appears to be distressed about not being able to sleep at all inspite of medication changes last night. Patient today reports several withdrawal sx like cold sweats ,tremors as well as anxiety. Patient's sleep issues could also be secondary to his withdrawal from his drugs. Patient continues to be motivated to get help with his substance abuse problems and is requesting to be referred to a residential program. [facility] and this was discussed with patient.  Pt denies SI/HI. Pt denies side effects of meds.    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   Non Psychiatric Diagnosis: Please see pmh    Total Time spent with patient: 30 minutes  ADL's:  Intact  Sleep: Poor  Appetite:  Fair  Psychiatric Specialty Exam: Physical Exam  ROS  Blood pressure 103/70, pulse 77, temperature 97.8 F (36.6 C), temperature source Oral, resp. rate 20, height 5' 8.31" (1.735 m), weight 105.235 kg (232 lb), SpO2 97 %.Body mass index is 34.96 kg/(m^2).  General Appearance: Fairly Groomed  Patent attorneyye Contact::  Fair  Speech:  Normal Rate  Volume:  Normal  Mood:  Depressed and anxious  Affect:  Depressed  Thought Process:  Goal Directed  Orientation:  Full (Time, Place, and Person)  Thought Content:  Hallucinations: Auditory Visual hearing some one call his name as well as seeing flashes-resolving  Suicidal  Thoughts:  No  Homicidal Thoughts:  No  Memory:  Negative  Judgement:  Impaired  Insight:  Fair  Psychomotor Activity:  Normal  Concentration:  Fair  Recall:  FiservFair  Fund of Knowledge:Fair  Language: Fair  Akathisia:  Negative  Handed:  Right  AIMS (if indicated):     Assets:  Communication Skills Desire for Improvement  Sleep:  Number of Hours: 5.25   Musculoskeletal: Strength & Muscle Tone: within normal limits Gait & Station: normal Patient leans: N/A  Current Medications: Current Facility-Administered Medications  Medication Dose Route Frequency Provider Last Rate Last Dose  . acetaminophen (TYLENOL) tablet 650 mg  650 mg Oral Q6H PRN Beau FannyJohn C Withrow, FNP   650 mg at 08/20/14 1727  . alum & mag hydroxide-simeth (MAALOX/MYLANTA) 200-200-20 MG/5ML suspension 30 mL  30 mL Oral Q4H PRN Beau FannyJohn C Withrow, FNP      . risperiDONE (RISPERDAL M-TABS) disintegrating tablet 1 mg  1 mg Oral QHS Nathifa Ritthaler, MD       And  . benztropine (COGENTIN) tablet 0.5 mg  0.5 mg Oral QHS Jackson Fetters, MD      . cloNIDine (CATAPRES) tablet 0.1 mg  0.1 mg Oral QAC breakfast Beau FannyJohn C Withrow, FNP   0.1 mg at 08/23/14 [id number]  . dicyclomine (BENTYL) tablet 20 mg  20 mg Oral Q6H PRN Beau FannyJohn C Withrow, FNP   20 mg at 08/21/14 0816  . feeding supplement (ENSURE COMPLETE) (ENSURE COMPLETE) liquid 237 mL  237 mL Oral BID BM Tilda FrancoLindsey Baker, RD   237 mL at 08/23/14 0844  . [START ON 08/24/2014] FLUoxetine (PROZAC) capsule 40 mg  40 mg  Oral Daily Jomarie LongsSaramma Kameka Whan, MD      . gabapentin (NEURONTIN) capsule 200 mg  200 mg Oral TID Jomarie LongsSaramma Corrina Steffensen, MD   200 mg at 08/23/14 0844  . gabapentin (NEURONTIN) capsule 200 mg  200 mg Oral QHS Zoraida Havrilla, MD      . hydrOXYzine (VISTARIL) injection 50 mg  50 mg Intramuscular Q8H PRN Caprice KluverVinay P Saranga, MD      . loperamide (IMODIUM) capsule 2-4 mg  2-4 mg Oral PRN Beau FannyJohn C Withrow, FNP   2 mg at 08/19/14 1654  . magnesium hydroxide (MILK OF MAGNESIA) suspension 30 mL  30 mL Oral Daily PRN  Beau FannyJohn C Withrow, FNP      . methocarbamol (ROBAXIN) tablet 500 mg  500 mg Oral Q8H PRN Beau FannyJohn C Withrow, FNP   500 mg at 08/21/14 [id number]  . multivitamin with minerals tablet 1 tablet  1 tablet Oral Daily Jomarie LongsSaramma Maddoxx Burkitt, MD   1 tablet at 08/23/14 0844  . naproxen (NAPROSYN) tablet 500 mg  500 mg Oral BID PRN Beau FannyJohn C Withrow, FNP   500 mg at 08/22/14 2105  . ondansetron (ZOFRAN-ODT) disintegrating tablet 4 mg  4 mg Oral Q6H PRN Beau FannyJohn C Withrow, FNP   4 mg at 08/19/14 [id number]  . thiamine (VITAMIN B-1) tablet 100 mg  100 mg Oral Daily Jomarie LongsSaramma Rilyn Scroggs, MD   100 mg at 08/23/14 0844  . traZODone (DESYREL) tablet 200 mg  200 mg Oral QHS Jomarie LongsSaramma Hymen Arnett, MD        Lab Results: No results found for this or any previous visit (from the past 48 hour(s)).  Physical Findings: AIMS: Facial and Oral Movements Muscles of Facial Expression: None, normal Lips and Perioral Area: None, normal Jaw: None, normal Tongue: None, normal,Extremity Movements Upper (arms, wrists, hands, fingers): None, normal Lower (legs, knees, ankles, toes): None, normal, Trunk Movements Neck, shoulders, hips: None, normal, Overall Severity Severity of abnormal movements (highest score from questions above): None, normal Incapacitation due to abnormal movements: None, normal Patient's awareness of abnormal movements (rate only patient's report): No Awareness, Dental Status Current problems with teeth and/or dentures?: No Does patient usually wear dentures?: No  CIWA:  CIWA-Ar Total: 2 COWS:  COWS Total Score: 0  Treatment Plan Summary: Daily contact with patient to assess and evaluate symptoms and progress in treatment Medication management   Plan: Continue Clonidine /cows protocol. Continue CIWA/Ativan protocol. Patient with significant hx of alcohol abuse/DTs in the past.  Will increase Prozac to 40 mg po daily for affective sx. His affective sx could be 2/2 his substance abuse. Will continue a small dose of Risperdal to 1 mg po qhs  for hallucination ,likely 2/2 to withdrawal . Will increase Trazodone to 200 mg po qhs for sleep issues.Will add a small dose of Neurontin at bedtime to help with anxiety sx. Will continue Neurontin  200 mg po tid.  CSW will work on disposition. Patient to be referred to a substance abuse program ,[facility] referrals made. Patient to participate in therapeutic milieu.   Medical Decision Making Problem Points:  Established problem, worsening (2), Review of last therapy session (1) and Review of psycho-social stressors (1) Data Points:  Order Aims Assessment (2) Review and summation of old records (2) Review of medication regiment & side effects (2) Review of new medications or change in dosage (2)  I certify that inpatient services furnished can reasonably be expected to improve the patient's condition.   Even Budlong MD 08/23/2014, 11:19 AM

## 2014-08-23 NOTE — Clinical Social Work Note (Signed)
[facility]. Pt [facility] for [facility] 08/24/14 (WED). Pickup at 2pm. Dr. Elna BreslowEappen notified. Labs and [facility] [phone]. Pt notified of acceptance into program and [facility].   Trula SladeHeather Smart, LCSWA 08/23/2014 3:01 PM

## 2014-08-24 DIAGNOSIS — F19239 Other psychoactive substance dependence with withdrawal, unspecified: Secondary | ICD-10-CM | POA: Insufficient documentation

## 2014-08-24 DIAGNOSIS — F3289 Other specified depressive episodes: Secondary | ICD-10-CM

## 2014-08-24 DIAGNOSIS — F1193 Opioid use, unspecified with withdrawal: Secondary | ICD-10-CM | POA: Insufficient documentation

## 2014-08-24 DIAGNOSIS — F10932 Alcohol use, unspecified with withdrawal with perceptual disturbance: Secondary | ICD-10-CM | POA: Insufficient documentation

## 2014-08-24 DIAGNOSIS — F10232 Alcohol dependence with withdrawal with perceptual disturbance: Secondary | ICD-10-CM | POA: Insufficient documentation

## 2014-08-24 DIAGNOSIS — F19951 Other psychoactive substance use, unspecified with psychoactive substance-induced psychotic disorder with hallucinations: Secondary | ICD-10-CM | POA: Insufficient documentation

## 2014-08-24 DIAGNOSIS — F1924 Other psychoactive substance dependence with psychoactive substance-induced mood disorder: Secondary | ICD-10-CM

## 2014-08-24 DIAGNOSIS — F1123 Opioid dependence with withdrawal: Secondary | ICD-10-CM | POA: Insufficient documentation

## 2014-08-24 MED ORDER — FLUOXETINE HCL 40 MG PO CAPS: 40.0000 mg | ORAL_CAPSULE | Freq: Every day | ORAL | Status: DC

## 2014-08-24 MED ORDER — TRAZODONE HCL 100 MG PO TABS: 200.0000 mg | ORAL_TABLET | Freq: Every day | ORAL | Status: DC

## 2014-08-24 MED ORDER — GABAPENTIN 100 MG PO CAPS: 200.0000 mg | ORAL_CAPSULE | Freq: Three times a day (TID) | ORAL | Status: DC

## 2014-08-24 MED ORDER — GABAPENTIN 100 MG PO CAPS: 200.0000 mg | ORAL_CAPSULE | Freq: Every day | ORAL | Status: DC

## 2014-08-24 MED ORDER — ADULT MULTIVITAMIN W/MINERALS CH: 1.0000 | ORAL_TABLET | Freq: Every day | ORAL | Status: DC

## 2014-08-24 MED ORDER — BENZTROPINE MESYLATE 0.5 MG PO TABS: 0.5000 mg | ORAL_TABLET | Freq: Every day | ORAL | Status: DC

## 2014-08-24 MED ORDER — RISPERIDONE 1 MG PO TBDP: 1.0000 mg | ORAL_TABLET | Freq: Every day | ORAL | Status: DC

## 2014-08-24 NOTE — Progress Notes (Signed)
Patient ID: Darren Mendoza, male   DOB: 1956-02-10, 58 y.o.   MRN: [id number]  Pt. Denies SI/HI and A/V hallucinations to this Clinical research associatewriter. Belongings returned to patient at time of discharge. Patient denies any pain or discomfort. Discharge instructions and medications were reviewed with patient. Patient verbalized understanding of both medications and discharge instructions. [facility] No distress noted at time of discharge. Q15 minute safety checks maintained until discharge.

## 2014-08-24 NOTE — Progress Notes (Signed)
Patient ID: Darren Mendoza, male   DOB: [birth year], 58 y.o.   MRN: [id number] D: Patient alert and cooperative. Pt reports he had a good day. Pt mood/affect is anxious. Pt denies any withdrawal symptoms. Pt endorses passive SI without a plan and verbally contract to come to staff if feeling unsafe. Pt denies HI/AVH. No acute distressed noted at this time.   A: Medications administered as prescribed. Emotional support given and will continue to monitor pt's progress for stabilization.  R: Patient is safe and complaint with medications.

## 2014-08-24 NOTE — [facility] Group Notes (Signed)
[facility]   08/24/2014 10:40 AM  Participation Quality:  Appropriate   Mood/Affect:  Appropriate  Depression Rating:  0  Anxiety Rating:  0  Thoughts of Suicide:  No Will you contract for safety?   NA  Current AVH:  No  Plan for Discharge/Comments:  Pt reports that he is excited to be d/cing to [facility] at 2pm today. 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 Pt reported that sleep medications have been helping him sleep well for the past few nights and is hoping to leave with Rx for this.   Transportation Means: [facility] coming to transport pt to their facility at approximately 2pm today, 10/25/13.   Supports: none identified by pt.   Smart, American FinancialHeather LCSWA

## 2014-08-24 NOTE — Progress Notes (Signed)
[facility] Discharge [facility] :  Will you be returning to the same living situation after discharge: No.[facility]  At discharge, do you have transportation home?:Yes,  [facility] transporting pt to their facility at approximately 2pm today. Do you have the ability to pay for your medications:Yes,  mental health  Release of information consent forms completed and submitted to Medical Records by CSW.  Patient to Follow up at: Follow-up Information    Follow up with [facility] On 08/24/2014.   Why:  [facility] transportation coming at approximately 2pm on this date to transport you to this facility for inpatient treatment.    Contact information:   [address] [place], [place] [address] Phone: [phone] Fax: [phone]      [facility]   Why:  Walk in between 8am-9am Monday through Friday for hospital follow-up/medication management/assessment for therapy services.    Contact information:   [address]  [place], [place] [address] Phone: [phone] Fax: [phone]      Patient denies SI/HI:   Yes,  during group/self report.     Safety Planning and Suicide Prevention discussed:  Yes,  Contact attempts made with pt's aunt/uncle and voicemails left requesting call back. SPE completed with pt. SPI pamphlet provided to pt and he was encouraged to share information with support network, ask questions, and talk about any concerns relating to SPE.  Smart, Taytum Scheck LCSWA  08/24/2014, 10:45 AM

## 2014-08-24 NOTE — [facility] Suicide Risk Assessment (Signed)
   Demographic Factors:  Male, Divorced or widowed, Caucasian, Low socioeconomic status and Unemployed  Total Time spent with patient: 45 minutes  Psychiatric Specialty Exam: Physical Exam  ROS  Blood pressure 126/73, pulse 86, temperature 97.7 F (36.5 C), temperature source Oral, resp. rate 20, height 5' 8.31" (1.735 m), weight 105.235 kg (232 lb), SpO2 97 %.Body mass index is 34.96 kg/(m^2).  General Appearance: Casual  Eye Contact::  Fair  Speech:  Clear and Coherent  Volume:  Normal  Mood:  Euthymic  Affect:  Appropriate  Thought Process:  Coherent  Orientation:  Full (Time, Place, and Person)  Thought Content:  WDL  Suicidal Thoughts:  No  Homicidal Thoughts:  No  Memory:  Immediate;   Fair Recent;   Fair Remote;   Fair  Judgement:  Fair  Insight:  Fair  Psychomotor Activity:  Normal  Concentration:  Fair  Recall:  FiservFair  Fund of Knowledge:Fair  Language: Good  Akathisia:  No  Handed:  Right  AIMS (if indicated):     Assets:  Communication Skills Desire for Improvement  Sleep:  Number of Hours: 5.5    Musculoskeletal: Strength & Muscle Tone: within normal limits Gait & Station: normal Patient leans: N/A   Mental Status Per Nursing Assessment::   On Admission:  Self-harm behaviors  Current Mental Status by Physician: Patient denies SI/HI/AH/VH  Loss Factors: Loss of significant relationship and Financial problems/change in socioeconomic status  Historical Factors: Impulsivity  Risk Reduction Factors:   Positive therapeutic relationship  Continued Clinical Symptoms:  Alcohol/Substance Abuse/Dependencies Previous Psychiatric Diagnoses and Treatments  Cognitive Features That Contribute To Risk:  Patient is alert ,oriented x3    Suicide Risk:  Minimal: No identifiable suicidal ideation.    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   Non Psychiatric Diagnosis: Please see pmh    Past Medical History  Diagnosis Date  . Renal disorder   . Kidney stones   . Anxiety   . Depression   . Opiate withdrawal 11/09/2011     Plan Of Care/Follow-up recommendations: [facility] Activity:  no restrictions Diet:  regular  Is patient on multiple antipsychotic therapies at discharge:  No   Has Patient had three or more failed trials of antipsychotic monotherapy by history:  No  Recommended Plan for Multiple Antipsychotic Therapies: NA    Nymir Ringler MD 08/24/2014, 8:48 AM

## 2014-08-24 NOTE — B[facility] Suicide Risk Assessment (Signed)
[name] INPATIENT:  Family/Significant Other Suicide Prevention Education  Suicide Prevention Education:  Contact Attempts: Darren Mendoza and Darren FothergillRobin Mendoza (pt's uncle & aunt) [phone] has been identified by the patient as the family member/significant other with whom the patient will be residing, and identified as the person(s) who will aid the patient in the event of a mental health crisis.  With written consent from the patient, two attempts were made to provide suicide prevention education, prior to and/or following the patient's discharge.  We were unsuccessful in providing suicide prevention education.  A suicide education pamphlet was given to the patient to share with family/significant other.  Date and time of first attempt: 08/23/14 1:00PM (vm left requesting call back)  Date and time of second attempt:* 08/24/14 10:40AM (VM left requesting call back)   Smart, Darren Mendoza LCSWA  08/24/2014, 10:43 AM

## 2014-08-24 NOTE — Plan of Care (Signed)
Problem: Alteration in mood & ability to function due to Goal: STG-Patient will attend groups Outcome: Progressing Pt attended and engaged in group this evening

## 2014-08-24 NOTE — Discharge Summary (Signed)
Physician Discharge Summary Note  Patient:  Darren Mendoza is an 58 y.o., male MRN:  [id number] DOB:  1956/04/17 Patient phone:  [phone] (home)  Patient address:   [address] [place] [address],  Total Time spent with patient: 30 minutes  Date of Admission:  08/18/2014 Date of Discharge: 08/24/14  Reason for Admission:  Substance abuse, Mood stabilization  Discharge Diagnoses: Principal Problem:   Alcohol withdrawal with perceptual disturbances Active Problems:   Opiate withdrawal   Substance induced mood disorder   Depression   Opioid use disorder, severe, dependence   Alcohol use disorder, severe, dependence   Opioid withdrawal   Alcohol withdrawal syndrome with perceptual disturbance   Substance or medication-induced depressive disorder with onset during withdrawal   Substance-induced psychotic disorder with hallucinations  Psychiatric Specialty Exam: Physical Exam  Psychiatric: He has a normal mood and affect. His speech is normal and behavior is normal. Judgment and thought content normal. Cognition and memory are normal.    Review of Systems  Constitutional: Negative.   HENT: Negative.   Eyes: Negative.   Respiratory: Negative.   Cardiovascular: Negative.   Gastrointestinal: Negative.   Genitourinary: Negative.   Musculoskeletal: Negative.   Skin: Negative.   Neurological: Negative.   Endo/Heme/Allergies: Negative.   Psychiatric/Behavioral: Positive for substance abuse ([facility]). Negative for depression, suicidal ideas, hallucinations and memory loss. The patient is not nervous/anxious and does not have insomnia.     Blood pressure 126/73, pulse 86, temperature 97.7 F (36.5 C), temperature source Oral, resp. rate 20, height 5' 8.31" (1.735 m), weight 105.235 kg (232 lb), SpO2 97 %.Body mass index is 34.96 kg/(m^2).   See Physician SRA     Past Psychiatric History: See H&P Diagnosis:  Hospitalizations:  Outpatient  Care:  Substance Abuse Care:  Self-Mutilation:  Suicidal Attempts:  Violent Behaviors:   Musculoskeletal: Strength & Muscle Tone: within normal limits Gait & Station: normal Patient leans: N/A  DSM5:  Axis Diagnosis:   Primary Psychiatric Diagnosis: Opioid use disorder ,severe  Secondary Psychiatric Diagnosis: Opioid withdrawal with perceptual disturbances,severe Alcohol use disorder,severe Alcohol withdrawal, moderate Substance induced (alcohol,opioid )depressive disorder versus MDD Substance induced (alcohol,opioid) psychotic disorder with onset during withdrawal.  Non Psychiatric Diagnosis: Please see pmh  Level of Care:  OP  Hospital Course:  Darren Rmhomas Darren Mendoza is an 58 y.o. male with hx of depression, alcoholism, and opiate withdrawal. [facility]. He states that he drinks heavily. Patient reports drinking daily since 2013. Patient drinks a 6 pack of beer and a fifth of whiskey daily. Patient's last drink was 2 days ago. He also reports opiate use 50mg -100mg 's per day. His last use was also 2 days ago. Patient reports several withdrawal symptoms. Patient states that he started hearing voices last night of someone calling his name. Patient has a no prior hx of psychosis. Patient reports visual hallucinations of "spots". [facility]. He does not have a current outpatient provider.          Darren Mendoza Darren Kohn was admitted to the adult unit. He was evaluated and his symptoms were identified. Medication management was discussed and initiated. Patient was not taking any psychiatric medications prior to admission. The patient was started on Prozac for depression, which was increased to 40 mg daily to manage his symptoms. He was started on Risperdal M-tab 1 mg at bedtime for psychotic symptoms along with  Cogentin 0.5 mg hs for EPS prevention. He was also started on Neurontin 200 mg three times daily and at  bedtime for improved mood stability. The patient was detoxified safely from opiates with the Clonidine protocol and with the Ativan protocol for alcohol. He was oriented to the unit and encouraged to participate in unit programming. Medical problems were identified and treated appropriately. Home medication was restarted as needed.        The patient was evaluated each day by a clinical provider to ascertain the patient's response to treatment.  Improvement was noted by the patient's report of decreasing symptoms, improved sleep and appetite, affect, medication tolerance, behavior, and participation in unit programming.  He was asked each day to complete a self inventory noting mood, mental status, pain, new symptoms, anxiety and concerns.         He responded well to medication and being in a therapeutic and supportive environment. Positive and appropriate behavior was noted and the patient was motivated for recovery.  The patient worked closely with the treatment team and case manager to develop a discharge plan with appropriate goals. Coping skills, problem solving as well as relaxation therapies were also part of the unit programming. His auditory hallucinations gradually improved with the addition of a small dose of Risperdal. It was felt by treatment team that the psychosis could be related to withdrawal.         By the day of discharge he was in much improved condition than upon admission.  Symptoms were reported as significantly decreased or resolved completely.  The patient denied SI/HI and voiced no AVH. He was motivated to continue taking medication with a goal of continued improvement in mental health. Darren Mendoza was discharged home with a plan to follow up as noted below. Patient was provided with prescriptions and a two week supply of medications. He left [name] in stable condition for further substance abuse treatment at [facility].  Consults:  None  Significant Diagnostic Studies:  Chemistry panel,  CBC, UDS positive for opiates   Discharge Vitals:   Blood pressure 126/73, pulse 86, temperature 97.7 F (36.5 C), temperature source Oral, resp. rate 20, height 5' 8.31" (1.735 m), weight 105.235 kg (232 lb), SpO2 97 %. Body mass index is 34.96 kg/(m^2). Lab Results:   No results found for this or any previous visit (from the past 72 hour(s)).  Physical Findings: AIMS: Facial and Oral Movements Muscles of Facial Expression: None, normal Lips and Perioral Area: None, normal Jaw: None, normal Tongue: None, normal,Extremity Movements Upper (arms, wrists, hands, fingers): None, normal Lower (legs, knees, ankles, toes): None, normal, Trunk Movements Neck, shoulders, hips: None, normal, Overall Severity Severity of abnormal movements (highest score from questions above): None, normal Incapacitation due to abnormal movements: None, normal Patient's awareness of abnormal movements (rate only patient's report): No Awareness, Dental Status Current problems with teeth and/or dentures?: No Does patient usually wear dentures?: No  CIWA:  CIWA-Ar Total: 1 COWS:  COWS Total Score: 0  Psychiatric Specialty Exam: See Psychiatric Specialty Exam and Suicide Risk Assessment completed by Attending Physician prior to discharge.  Discharge destination:  [facility]  Is patient on multiple antipsychotic therapies at discharge:  No   Has Patient had three or more failed trials of antipsychotic monotherapy by history:  No  Recommended Plan for Multiple Antipsychotic Therapies: NA     Medication List    TAKE these medications      Indication   benztropine 0.5 MG tablet  Commonly known as:  COGENTIN  Take 1 tablet (0.5 mg total) by mouth at bedtime.   Indication:  Extrapyramidal Reaction caused by Medications     FLUoxetine 40 MG capsule  Commonly known as:  PROZAC  Take 1 capsule (40 mg total) by mouth daily.   Indication:  Depression     gabapentin 100 MG capsule  Commonly known as:  NEURONTIN   Take 2 capsules (200 mg total) by mouth 3 (three) times daily.   Indication:  Mood control     gabapentin 100 MG capsule  Commonly known as:  NEURONTIN  Take 2 capsules (200 mg total) by mouth at bedtime.   Indication:  Mood control     multivitamin with minerals Tabs tablet  Take 1 tablet by mouth daily.   Indication:  Vitamin Supplementation     risperiDONE 1 MG disintegrating tablet  Commonly known as:  RISPERDAL M-TABS  Take 1 tablet (1 mg total) by mouth at bedtime.   Indication:  Mood control     traZODone 100 MG tablet  Commonly known as:  DESYREL  Take 2 tablets (200 mg total) by mouth at bedtime.   Indication:  Trouble Sleeping       Follow-up Information    Follow up with [facility] On 08/24/2014.   Why:  [facility] transportation coming at approximately 2pm on this date to transport you to this facility for inpatient treatment.    Contact information:   [address] [place], [place] [address] Phone: [phone] Fax: [phone]      [facility] with [facility].   Why:  Walk in between 8am-9am Monday through Friday for hospital follow-up/medication management/assessment for therapy services.    Contact information:   [address]  [place], [place] [address] Phone: [phone] Fax: [phone]     Follow-up recommendations:   Activity: no restrictions Diet: regular  Comments:   Take all your medications as prescribed by your mental healthcare provider.  Report any adverse effects and or reactions from your medicines to your outpatient provider promptly.  Patient is instructed and cautioned to not engage in alcohol and or illegal drug use while on prescription medicines.  In the event of worsening symptoms, patient is instructed to call the crisis hotline, 911 and or go to the nearest ED for appropriate evaluation and treatment of symptoms.  Follow-up with your primary care provider for your other medical issues, concerns and or health care needs.   Total  Discharge Time:  Greater than 30 minutes.  SignedFransisca Kaufmann: Michaell Grider NP-C 08/24/2014, 3:49 PM

## 2014-08-29 NOTE — Progress Notes (Signed)
Patient Discharge Instructions:  After Visit Summary (AVS):   Faxed to:  08/29/14 Discharge Summary Note:   Faxed to:  08/29/14 Psychiatric Admission Assessment Note:   Faxed to:  08/29/14 Suicide Risk Assessment - Discharge Assessment:   Faxed to:  08/29/14 Faxed/Sent to the Next Level Care provider:  08/29/14 Faxed to [facility] @ [phone] No documentation [facility].  Per the SW the info was already sent.  Jerelene ReddenSheena E Walsh, 08/29/2014, 3:26 PM

## 2015-12-28 ENCOUNTER — Encounter (HOSPITAL_COMMUNITY): Payer: Self-pay

## 2015-12-28 ENCOUNTER — Emergency Department (HOSPITAL_COMMUNITY)
Admission: EM | Admit: 2015-12-28 | Discharge: 2015-12-29 | Disposition: A | Discharge status: Home / Self Care | Payer: Self-pay | Attending: Emergency Medicine | Admitting: Emergency Medicine

## 2015-12-28 DIAGNOSIS — N5082 Scrotal pain: Secondary | ICD-10-CM

## 2015-12-28 DIAGNOSIS — N50811 Right testicular pain: Secondary | ICD-10-CM

## 2015-12-28 DIAGNOSIS — Z8659 Personal history of other mental and behavioral disorders: Secondary | ICD-10-CM | POA: Insufficient documentation

## 2015-12-28 DIAGNOSIS — Z79899 Other long term (current) drug therapy: Secondary | ICD-10-CM | POA: Insufficient documentation

## 2015-12-28 DIAGNOSIS — Z87442 Personal history of urinary calculi: Secondary | ICD-10-CM | POA: Insufficient documentation

## 2015-12-28 DIAGNOSIS — F17210 Nicotine dependence, cigarettes, uncomplicated: Secondary | ICD-10-CM | POA: Insufficient documentation

## 2015-12-28 DIAGNOSIS — Z87448 Personal history of other diseases of urinary system: Secondary | ICD-10-CM | POA: Insufficient documentation

## 2015-12-28 DIAGNOSIS — R109 Unspecified abdominal pain: Secondary | ICD-10-CM

## 2015-12-28 DIAGNOSIS — R110 Nausea: Secondary | ICD-10-CM | POA: Insufficient documentation

## 2015-12-28 LAB — URINALYSIS, ROUTINE W REFLEX MICROSCOPIC
BILIRUBIN URINE: NEGATIVE
Glucose, UA: NEGATIVE mg/dL
Hgb urine dipstick: NEGATIVE
Ketones, ur: NEGATIVE mg/dL
Leukocytes, UA: NEGATIVE
Nitrite: NEGATIVE
Protein, ur: NEGATIVE mg/dL
Specific Gravity, Urine: 1.028 (ref 1.005–1.030)
pH: 6 (ref 5.0–8.0)

## 2015-12-28 NOTE — ED Notes (Signed)
Pt complains of flank pain that started today on his right side ans up through his back, hx of kidney stones and a hernia,

## 2015-12-29 ENCOUNTER — Emergency Department (HOSPITAL_COMMUNITY): Payer: Self-pay

## 2015-12-29 LAB — BASIC METABOLIC PANEL
Anion gap: 10 (ref 5–15)
BUN: 17 mg/dL (ref 6–20)
CHLORIDE: 108 mmol/L (ref 101–111)
CO2: 25 mmol/L (ref 22–32)
Calcium: 9.7 mg/dL (ref 8.9–10.3)
Creatinine, Ser: 0.73 mg/dL (ref 0.61–1.24)
GFR calc Af Amer: >60 mL/min (ref >60)
GFR calc non Af Amer: >60 mL/min (ref >60)
GLUCOSE: 129 mg/dL — AB (ref 65–99)
Potassium: 3.9 mmol/L (ref 3.5–5.1)
Sodium: 143 mmol/L (ref 135–145)

## 2015-12-29 LAB — I-STAT CHEM 8, ED
BUN: 20 mg/dL (ref 6–20)
CHLORIDE: 105 mmol/L (ref 101–111)
Calcium, Ion: 1.12 mmol/L (ref 1.12–1.23)
Creatinine, Ser: 0.7 mg/dL (ref 0.61–1.24)
Glucose, Bld: 109 mg/dL — ABNORMAL HIGH (ref 65–99)
HCT: 42 % (ref 39.0–52.0)
Hemoglobin: 14.3 g/dL (ref 13.0–17.0)
Potassium: 4 mmol/L (ref 3.5–5.1)
SODIUM: 142 mmol/L (ref 135–145)
TCO2: 27 mmol/L (ref 0–100)

## 2015-12-29 LAB — GC/CHLAMYDIA PROBE AMP (~~LOC~~) NOT AT ARMC
Chlamydia: NEGATIVE
NEISSERIA GONORRHEA: NEGATIVE

## 2015-12-29 MED ORDER — NAPROXEN 500 MG PO TABS: 500.0000 mg | ORAL_TABLET | Freq: Two times a day (BID) | ORAL | Status: DC

## 2015-12-29 MED ORDER — KETOROLAC TROMETHAMINE 15 MG/ML IJ SOLN
30.0000 mg | Freq: Once | INTRAMUSCULAR | Status: AC
  Administered 2015-12-29: 30 mg via INTRAVENOUS
  Filled 2015-12-29: qty 2

## 2015-12-29 MED ORDER — HYDROCODONE-ACETAMINOPHEN 5-325 MG PO TABS: 1.0000 | ORAL_TABLET | Freq: Four times a day (QID) | ORAL | Status: DC | PRN

## 2015-12-29 MED ORDER — HYDROMORPHONE HCL 1 MG/ML IJ SOLN
0.5000 mg | Freq: Once | INTRAMUSCULAR | Status: AC | PRN
  Administered 2015-12-29: 0.5 mg via INTRAVENOUS
  Filled 2015-12-29: qty 1

## 2015-12-29 MED ORDER — OXYCODONE-ACETAMINOPHEN 5-325 MG PO TABS
2.0000 | ORAL_TABLET | Freq: Once | ORAL | Status: AC
  Administered 2015-12-29: 2 via ORAL
  Filled 2015-12-29: qty 2

## 2015-12-29 MED ORDER — ONDANSETRON 4 MG PO TBDP
4.0000 mg | ORAL_TABLET | Freq: Once | ORAL | Status: AC
  Administered 2015-12-29: 4 mg via ORAL
  Filled 2015-12-29: qty 1

## 2015-12-29 MED ORDER — ONDANSETRON HCL 4 MG/2ML IJ SOLN
4.0000 mg | Freq: Once | INTRAMUSCULAR | Status: AC
  Administered 2015-12-29: 4 mg via INTRAVENOUS
  Filled 2015-12-29: qty 2

## 2015-12-29 MED ORDER — OXYCODONE-ACETAMINOPHEN 5-325 MG PO TABS
1.0000 | ORAL_TABLET | Freq: Once | ORAL | Status: AC
  Administered 2015-12-29: 1 via ORAL
  Filled 2015-12-29: qty 1

## 2015-12-29 NOTE — ED Notes (Signed)
PA at bedside.

## 2015-12-29 NOTE — ED Provider Notes (Signed)
CSN: [id number]     Arrival date & time 12/28/15  2225 History   First MD Initiated Contact with Patient 12/29/15 0133     Chief Complaint  Patient presents with  . Flank Pain    (Consider location/radiation/quality/duration/timing/severity/associated sxs/prior Treatment) HPI Comments: 60 year old male with a history of kidney stones, anxiety, depression, and opiate withdrawal presents to the emergency department for evaluation of right-sided flank pain which was sudden in onset at 10 AM yesterday. Patient reports a constant, aching pain in his right flank as well as a throbbing pain in his right testicle which is intermittent with associated chart pain sensations at some times. Pain is waxing and waning in severity and has not been relieved by Tylenol or ibuprofen prior to arrival. Patient has had associated nausea without fever, vomiting, hematuria, difficulty voiding, testicle swelling, penile swelling, melena, hematochezia, or diarrhea. He reports that his pain feels slightly similar to his kidney stones, though the pain in his testicle seems consistent with his past diagnosis of a hernia. He has a history of hernia surgery 2 years ago. He also reports requiring lithotripsy 2 at [facility] for prior kidney stones. No other history of abdominal surgeries.  Patient is a 60 y.o. male presenting with flank pain. The history is provided by the patient. No language interpreter was used.  Flank Pain    Past Medical History  Diagnosis Date  . Renal disorder   . Kidney stones   . Anxiety   . Depression   . Opiate withdrawal (HCC) 11/09/2011   Past Surgical History  Procedure Laterality Date  . L ankle surgery      Plates and screws [facility] last October at [facility]  . Reconstructive surgery r great toe     Family History  Problem Relation Age of Onset  . Heart failure Mother   . Anesthesia problems Neg Hx   . Hypotension Neg Hx   . Malignant hyperthermia Neg Hx   . Pseudochol deficiency Neg  Hx    Social History  Substance Use Topics  . Smoking status: Current Every Day Smoker -- 0.50 packs/day for 15 years    Types: Cigarettes  . Smokeless tobacco: Current User    Types: Snuff     Comment: declined cessation material  . Alcohol Use: 3.6 oz/week    6 Cans of beer per week    Review of Systems  Genitourinary: Positive for flank pain.      Allergies  Review of patient's allergies indicates no known allergies.  Home Medications   Prior to Admission medications   Medication Sig Start Date End Date Taking? Authorizing Provider  acetaminophen (TYLENOL) 500 MG tablet Take 1,000 mg by mouth every 6 (six) hours as needed for mild pain, moderate pain or headache.   Yes Historical Provider, MD  ibuprofen (ADVIL,MOTRIN) 800 MG tablet Take 800 mg by mouth every 8 (eight) hours as needed for headache, mild pain or moderate pain.   Yes Historical Provider, MD  Multiple Vitamins-Minerals (MULTIVITAMIN ADULTS 50+ PO) Take 1 tablet by mouth daily.   Yes Historical Provider, MD  benztropine (COGENTIN) 0.5 MG tablet Take 1 tablet (0.5 mg total) by mouth at bedtime. Patient not taking: Reported on 12/28/2015 08/24/14   Thermon Leyland, NP  FLUoxetine (PROZAC) 40 MG capsule Take 1 capsule (40 mg total) by mouth daily. Patient not taking: Reported on 12/28/2015 08/24/14   Thermon Leyland, NP  gabapentin (NEURONTIN) 100 MG capsule Take 2 capsules (200 mg total)  by mouth 3 (three) times daily. Patient not taking: Reported on 12/28/2015 08/24/14   Thermon Leyland, NP  gabapentin (NEURONTIN) 100 MG capsule Take 2 capsules (200 mg total) by mouth at bedtime. Patient not taking: Reported on 12/28/2015 08/24/14   Thermon Leyland, NP  HYDROcodone-acetaminophen (NORCO/VICODIN) 5-325 MG tablet Take 1 tablet by mouth every 6 (six) hours as needed for severe pain. 12/29/15   Antony Madura, PA-C  Multiple Vitamin (MULTIVITAMIN WITH MINERALS) TABS tablet Take 1 tablet by mouth daily. Patient not taking: Reported on  12/28/2015 08/24/14   Thermon Leyland, NP  naproxen (NAPROSYN) 500 MG tablet Take 1 tablet (500 mg total) by mouth 2 (two) times daily. 12/29/15   Antony Madura, PA-C  risperiDONE (RISPERDAL M-TABS) 1 MG disintegrating tablet Take 1 tablet (1 mg total) by mouth at bedtime. Patient not taking: Reported on 12/28/2015 08/24/14   Thermon Leyland, NP  traZODone (DESYREL) 100 MG tablet Take 2 tablets (200 mg total) by mouth at bedtime. Patient not taking: Reported on 12/28/2015 08/24/14   Thermon Leyland, NP   BP 143/81 mmHg  Pulse 67  Temp(Src) 97.9 F (36.6 C) (Oral)  Resp 18  SpO2 99%   Physical Exam  Constitutional: He is oriented to person, place, and time. He appears well-developed and well-nourished. No distress.  Patient seems uncomfortable. Nontoxic/nonseptic appearing.  HENT:  Head: Normocephalic and atraumatic.  Eyes: Conjunctivae and EOM are normal. No scleral icterus.  Neck: Normal range of motion.  Cardiovascular: Normal rate, regular rhythm and intact distal pulses.   Pulmonary/Chest: Effort normal. No respiratory distress. He has no wheezes.  Respirations even and unlabored  Abdominal: Soft. He exhibits no distension. There is no tenderness. There is no rebound and no guarding. Hernia confirmed negative in the right inguinal area and confirmed negative in the left inguinal area.  No focal abdominal tenderness to palpation. No masses or peritoneal signs. No CVA tenderness.  Genitourinary: Right testis shows no mass, no swelling and no tenderness. Right testis is descended. Left testis shows no mass, no swelling and no tenderness. Left testis is descended. Circumcised. No penile tenderness. No discharge found.  Exam chaperoned by nurse  Musculoskeletal: Normal range of motion.  Neurological: He is alert and oriented to person, place, and time. He exhibits normal muscle tone. Coordination normal.  GCS 15. Patient moving all extremities.  Skin: Skin is warm and dry. No rash noted. He is not  diaphoretic. No erythema. No pallor.  Psychiatric: He has a normal mood and affect. His behavior is normal.  Nursing note and vitals reviewed.   ED Course  Procedures (including critical care time) Labs Review Labs Reviewed  BASIC METABOLIC PANEL - Abnormal; Notable for the following:    Glucose, Bld 129 (*)    All other components within normal limits  I-STAT CHEM 8, ED - Abnormal; Notable for the following:    Glucose, Bld 109 (*)    All other components within normal limits  URINALYSIS, ROUTINE W REFLEX MICROSCOPIC (NOT AT [facility])  GC/CHLAMYDIA PROBE AMP ([place]) NOT AT [facility]    Imaging Review US Scrotum  12/29/2015  CLINICAL DATA:  Right scrotal pain, onset yesterday EXAM: SCROTAL ULTRASOUND DOPPLER ULTRASOUND OF THE TESTICLES TECHNIQUE: Complete ultrasound examination of the testicles, epididymis, and other scrotal structures was performed. Color and spectral Doppler ultrasound were also utilized to evaluate blood flow to the testicles. COMPARISON:  None. FINDINGS: Right testicle Measurements: 4.5 x 2.1 x 3.1 cm. No mass  or microlithiasis visualized. 2 x 4 mm cyst noted. Left testicle Measurements: 4.4 x 2.2 x 2.8 cm. No mass or microlithiasis visualized. Right epididymis:  Normal in size and appearance. Left epididymis:  Normal in size and appearance. Hydrocele:  None visualized. Varicocele:  Left varicocele, confirmed on Doppler during Valsalva. Pulsed Doppler interrogation of both testes demonstrates normal low resistance arterial and venous waveforms bilaterally. IMPRESSION: No testicular mass or torsion.  There is a left varicocele. Electronically Signed   By: Ellery Plunk M.D.   On: 12/29/2015 05:04   [place] Art/ven Flow Abd Pelv Doppler  12/29/2015  CLINICAL DATA:  Right scrotal pain, onset yesterday EXAM: SCROTAL ULTRASOUND DOPPLER ULTRASOUND OF THE TESTICLES TECHNIQUE: Complete ultrasound examination of the testicles, epididymis, and other scrotal structures was performed.  Color and spectral Doppler ultrasound were also utilized to evaluate blood flow to the testicles. COMPARISON:  None. FINDINGS: Right testicle Measurements: 4.5 x 2.1 x 3.1 cm. No mass or microlithiasis visualized. 2 x 4 mm cyst noted. Left testicle Measurements: 4.4 x 2.2 x 2.8 cm. No mass or microlithiasis visualized. Right epididymis:  Normal in size and appearance. Left epididymis:  Normal in size and appearance. Hydrocele:  None visualized. Varicocele:  Left varicocele, confirmed on Doppler during Valsalva. Pulsed Doppler interrogation of both testes demonstrates normal low resistance arterial and venous waveforms bilaterally. IMPRESSION: No testicular mass or torsion.  There is a left varicocele. Electronically Signed   By: Ellery Plunk M.D.   On: 12/29/2015 05:04   Ct Renal Stone Study  12/29/2015  CLINICAL DATA:  Acute onset of right flank and right scapular pain. Initial encounter. EXAM: CT ABDOMEN AND PELVIS WITHOUT CONTRAST TECHNIQUE: Multidetector CT imaging of the abdomen and pelvis was performed following the standard protocol without IV contrast. COMPARISON:  CT of the abdomen and pelvis performed 08/15/2014 FINDINGS: The visualized lung bases are clear. A 7.2 cm cyst at the right hepatic lobe has increased in size from the prior study. There is a mildly nodular contour to the liver, raising concern for mild hepatic cirrhosis. The spleen is mildly enlarged, measuring 14.2 cm in length. The gallbladder is grossly unremarkable. The pancreas and adrenal glands are unremarkable. A small nonobstructing 3 mm stone is noted at the upper pole of the right kidney. There is no evidence of hydronephrosis. No obstructing ureteral stones are seen. No perinephric stranding is appreciated. No free fluid is identified. The small bowel is unremarkable in appearance. The stomach is within normal limits. No acute vascular abnormalities are seen. Minimal calcification is seen along the abdominal aorta and its  branches. The appendix is normal in caliber, without evidence of appendicitis. Mild diverticulosis is noted along the sigmoid colon, without evidence of diverticulitis. The bladder is mildly distended and grossly unremarkable. The prostate remains normal in size. No inguinal lymphadenopathy is seen. No acute osseous abnormalities are identified. IMPRESSION: 1. No acute abnormality seen to explain the patient's symptoms. 2. 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 3. Mild splenomegaly. 4. 7.2 cm hepatic cyst has increased in size from the prior study. 5. Small nonobstructing 3 mm stone at the upper pole of the right kidney. 6. Mild diverticulosis along the sigmoid colon, without evidence of diverticulitis. 7. Electronically Signed   By: Roanna Raider M.D.   On: 12/29/2015 03:19     I have personally reviewed and evaluated these images and lab results as part of my medical decision-making.   EKG Interpretation None  MDM   Final diagnoses:  Right flank pain  Right testicular pain    60 year old male presents to the emergency department for evaluation of right flank pain and right testicle pain. He has a history of ED visits for similar complaints with a rather unremarkable workup. Patient is afebrile and well-appearing, though he does seem uncomfortable. Patient was given Toradol with no improvement after which time he was given a small dose of IV Dilaudid and oral Percocet with more moderate improvement in his pain. Physical exam is noncontributory. His urinalysis does not suggest infection. There is no hematuria to suggest kidney stone. Kidney function normal today. Patient concerned about, both, kidney stones and hernia. A CT was obtained which is negative for acute abnormality. Patient also received an ultrasound of his scrotum to further evaluate for possible torsion or masses or infectious etiology. His ultrasound, also, is reassuring and negative for acute  findings.  Labs and imaging reviewed with the patient who verbalizes understanding. I have recommended outpatient urology follow-up for further evaluation of his symptoms. Patient given prescription for NSAIDs and 6 tablets of Norco for severe pain control. Return precautions given at discharge. Patient discharged in satisfactory condition with no unaddressed concerns.   Filed Vitals:   12/29/15 0210 12/29/15 0409 12/29/15 0413 12/29/15 0519  BP:   162/72 143/81  Pulse: 70 57  67  Temp:    97.9 F (36.6 C)  TempSrc:    Oral  Resp:    18  SpO2: 98% 99%  99%     Antony MaduraKelly Malaysha Arlen, PA-C 12/29/15 0604  Dione Boozeavid Glick, MD 12/29/15 [phone]

## 2015-12-29 NOTE — Discharge Instructions (Signed)
Flank Pain °Flank pain refers to pain that is located on the side of the body between the upper abdomen and the back. The pain may occur over a short period of time (acute) or may be long-term or reoccurring (chronic). It may be mild or severe. Flank pain can be caused by many things. °CAUSES  °Some of the more common causes of flank pain include: °· Muscle strains.   °· Muscle spasms.   °· A disease of your spine (vertebral disk disease).   °· A lung infection (pneumonia).   °· Fluid around your lungs (pulmonary edema).   °· A kidney infection.   °· Kidney stones.   °· A very painful skin rash caused by the chickenpox virus (shingles).   °· Gallbladder disease.   °HOME CARE INSTRUCTIONS  °Home care will depend on the cause of your pain. In general, °· Rest as directed by your caregiver. °· Drink enough fluids to keep your urine clear or pale yellow. °· Only take over-the-counter or prescription medicines as directed by your caregiver. Some medicines may help relieve the pain. °· Tell your caregiver about any changes in your pain. °· Follow up with your caregiver as directed. °SEEK IMMEDIATE MEDICAL CARE IF:  °· Your pain is not controlled with medicine.   °· You have new or worsening symptoms. °· Your pain increases.   °· You have abdominal pain.   °· You have shortness of breath.   °· You have persistent nausea or vomiting.   °· You have swelling in your abdomen.   °· You feel faint or pass out.   °· You have blood in your urine. °· You have a fever or persistent symptoms for more than 2-3 days. °· You have a fever and your symptoms suddenly get worse. °MAKE SURE YOU:  °· Understand these instructions. °· Will watch your condition. °· Will get help right away if you are not doing well or get worse. °  °This information is not intended to replace advice given to you by your health care provider. Make sure you discuss any questions you have with your health care provider. °  °Document Released: 10/24/2005 Document  Revised: 05/27/2012 Document Reviewed: 04/16/2012 °Elsevier Interactive Patient Education ©2016 [employer] ° °

## 2016-03-26 ENCOUNTER — Emergency Department (HOSPITAL_BASED_OUTPATIENT_CLINIC_OR_DEPARTMENT_OTHER)
Admission: EM | Admit: 2016-03-26 | Discharge: 2016-03-26 | Disposition: A | Discharge status: Home / Self Care | Payer: Self-pay | Attending: Emergency Medicine | Admitting: Emergency Medicine

## 2016-03-26 ENCOUNTER — Emergency Department (HOSPITAL_BASED_OUTPATIENT_CLINIC_OR_DEPARTMENT_OTHER): Payer: Self-pay

## 2016-03-26 ENCOUNTER — Encounter (HOSPITAL_BASED_OUTPATIENT_CLINIC_OR_DEPARTMENT_OTHER): Payer: Self-pay

## 2016-03-26 DIAGNOSIS — R109 Unspecified abdominal pain: Secondary | ICD-10-CM

## 2016-03-26 DIAGNOSIS — F329 Major depressive disorder, single episode, unspecified: Secondary | ICD-10-CM | POA: Insufficient documentation

## 2016-03-26 DIAGNOSIS — R1032 Left lower quadrant pain: Secondary | ICD-10-CM

## 2016-03-26 DIAGNOSIS — N390 Urinary tract infection, site not specified: Secondary | ICD-10-CM | POA: Insufficient documentation

## 2016-03-26 DIAGNOSIS — R110 Nausea: Secondary | ICD-10-CM | POA: Insufficient documentation

## 2016-03-26 DIAGNOSIS — F17210 Nicotine dependence, cigarettes, uncomplicated: Secondary | ICD-10-CM | POA: Insufficient documentation

## 2016-03-26 HISTORY — DX: Opioid abuse, uncomplicated: F11.10

## 2016-03-26 HISTORY — DX: Alcohol abuse, uncomplicated: F10.10

## 2016-03-26 LAB — URINALYSIS, ROUTINE W REFLEX MICROSCOPIC
GLUCOSE, UA: NEGATIVE mg/dL
HGB URINE DIPSTICK: NEGATIVE
Ketones, ur: 15 mg/dL — AB
Nitrite: NEGATIVE
Protein, ur: NEGATIVE mg/dL
Specific Gravity, Urine: 1.027 (ref 1.005–1.030)
pH: 5.5 (ref 5.0–8.0)

## 2016-03-26 LAB — CBC WITH DIFFERENTIAL/PLATELET
BASOS PCT: 0 %
Basophils Absolute: 0 10*3/uL (ref 0.0–0.1)
Eosinophils Absolute: 0.1 10*3/uL (ref 0.0–0.7)
Eosinophils Relative: 1 %
HEMATOCRIT: 41.9 % (ref 39.0–52.0)
Hemoglobin: 14.2 g/dL (ref 13.0–17.0)
LYMPHS PCT: 25 %
Lymphs Abs: 2 10*3/uL (ref 0.7–4.0)
MCH: 29.7 pg (ref 26.0–34.0)
MCHC: 33.9 g/dL (ref 30.0–36.0)
MCV: 87.7 fL (ref 78.0–100.0)
MONO ABS: 0.7 10*3/uL (ref 0.1–1.0)
MONOS PCT: 9 %
NEUTROS ABS: 5.2 10*3/uL (ref 1.7–7.7)
Neutrophils Relative %: 65 %
Platelets: 166 10*3/uL (ref 150–400)
RBC: 4.78 MIL/uL (ref 4.22–5.81)
RDW: 14 % (ref 11.5–15.5)
WBC: 8 10*3/uL (ref 4.0–10.5)

## 2016-03-26 LAB — BASIC METABOLIC PANEL
Anion gap: 10 (ref 5–15)
BUN: 19 mg/dL (ref 6–20)
CO2: 22 mmol/L (ref 22–32)
Calcium: 9.2 mg/dL (ref 8.9–10.3)
Chloride: 107 mmol/L (ref 101–111)
Creatinine, Ser: 0.67 mg/dL (ref 0.61–1.24)
GFR calc Af Amer: >60 mL/min (ref >60)
GLUCOSE: 114 mg/dL — AB (ref 65–99)
Potassium: 3.9 mmol/L (ref 3.5–5.1)
Sodium: 139 mmol/L (ref 135–145)

## 2016-03-26 LAB — URINE MICROSCOPIC-ADD ON

## 2016-03-26 MED ORDER — DEXTROSE 5 % IV SOLN
1.0000 g | Freq: Once | INTRAVENOUS | Status: AC
  Administered 2016-03-26: 1 g via INTRAVENOUS
  Filled 2016-03-26: qty 10

## 2016-03-26 MED ORDER — CEPHALEXIN 500 MG PO CAPS: 500.0000 mg | ORAL_CAPSULE | Freq: Four times a day (QID) | ORAL | Status: DC

## 2016-03-26 MED ORDER — NAPROXEN 500 MG PO TABS: 500.0000 mg | ORAL_TABLET | Freq: Two times a day (BID) | ORAL | Status: DC

## 2016-03-26 MED ORDER — KETOROLAC TROMETHAMINE 30 MG/ML IJ SOLN
30.0000 mg | Freq: Once | INTRAMUSCULAR | Status: AC
  Administered 2016-03-26: 30 mg via INTRAVENOUS
  Filled 2016-03-26: qty 1

## 2016-03-26 MED ORDER — ONDANSETRON HCL 4 MG/2ML IJ SOLN
4.0000 mg | Freq: Once | INTRAMUSCULAR | Status: AC
  Administered 2016-03-26: 4 mg via INTRAVENOUS
  Filled 2016-03-26: qty 2

## 2016-03-26 NOTE — ED Provider Notes (Signed)
CSN: [id number]     Arrival date & time 03/26/16  1924 History  By signing my name below, I, Phillis Haggis, attest that this documentation has been prepared under the direction and in the presence of Arthor Captain, PA-C. Electronically Signed: Phillis Haggis, ED Scribe. 03/26/2016. 7:52 PM.   Chief Complaint  Patient presents with  . Flank Pain   The history is provided by the patient. No language interpreter was used.  HPI Comments: Darren Mendoza is a 60 y.o. male with a hx of renal disorder, kidney stones, opiate abuse, and alcohol abuse who presents to the Emergency Department complaining of left flank pain that radiates to the groin onset 3 days ago, worsening today. He reports associated nausea and difficulty starting a stream of urine. He denies fever, chills, vomiting, dysuria, penile discharge, hematuria, frequency, pain, redness or swelling of the scrotum or penis. He reports hx of kidney stones and states that the pain is similar.    Past Medical History  Diagnosis Date  . Renal disorder   . Kidney stones   . Anxiety   . Depression   . Opiate withdrawal (HCC) 11/09/2011  . Opiate abuse, episodic   . Alcohol abuse    Past Surgical History  Procedure Laterality Date  . L ankle surgery      Plates and screws placed last October at [facility] [address]  . Reconstructive surgery r great toe     Family History  Problem Relation Age of Onset  . Heart failure Mother   . Anesthesia problems Neg Hx   . Hypotension Neg Hx   . Malignant hyperthermia Neg Hx   . Pseudochol deficiency Neg Hx    Social History  Substance Use Topics  . Smoking status: Current Every Day Smoker -- 0.50 packs/day for 15 years    Types: Cigarettes  . Smokeless tobacco: Current User    Types: Snuff     Comment: declined cessation material  . Alcohol Use: No    Review of Systems  Constitutional: Negative for fever and chills.  Gastrointestinal: Positive for nausea. Negative for vomiting.  Genitourinary:  Positive for flank pain and difficulty urinating (hesitancy with urination). Negative for dysuria, frequency, hematuria, discharge, penile swelling, scrotal swelling, penile pain and testicular pain.  All other systems reviewed and are negative.   Allergies  Morphine and related  Home Medications   Prior to Admission medications   Medication Sig Start Date End Date Taking? Authorizing Provider  cephALEXin (KEFLEX) 500 MG capsule Take 1 capsule (500 mg total) by mouth 4 (four) times daily. Patient not taking: Reported on 03/28/2016 03/26/16   Arthor Captain, PA-C  naproxen (NAPROSYN) 500 MG tablet Take 1 tablet (500 mg total) by mouth 2 (two) times daily. Patient not taking: Reported on 03/28/2016 03/26/16   Arthor Captain, PA-C  oxyCODONE-acetaminophen (PERCOCET) 5-325 MG tablet Take 1 tablet by mouth every 6 (six) hours as needed. 03/28/16   Bethann Berkshire, MD  predniSONE (DELTASONE) 10 MG tablet Take 2 tablets (20 mg total) by mouth daily. 03/28/16   Bethann Berkshire, MD   BP 146/83 mmHg  Pulse 74  Temp(Src) 98.2 F (36.8 C) (Oral)  Resp 18  Ht 5' 9.5" (1.765 m)  Wt 108.863 kg  BMI 34.95 kg/m2  SpO2 99% Physical Exam  Constitutional: He is oriented to person, place, and time. He appears well-developed and well-nourished.  Afebrile  HENT:  Head: Normocephalic and atraumatic.  Eyes: EOM are normal. Pupils are equal, round, and reactive  to light.  Neck: Normal range of motion. Neck supple.  Cardiovascular: Normal rate, regular rhythm and normal heart sounds.  Exam reveals no gallop and no friction rub.   No murmur heard. Pulmonary/Chest: Effort normal and breath sounds normal. He has no wheezes.  Abdominal: Soft. There is tenderness. There is no CVA tenderness. No hernia.  TTP of left groin  Musculoskeletal: Normal range of motion.  TTP above left gluteus maximus  Neurological: He is alert and oriented to person, place, and time.  Skin: Skin is warm and dry.  Psychiatric: He has a  normal mood and affect. His behavior is normal.  Nursing note and vitals reviewed.   ED Course  Procedures (including critical care time) DIAGNOSTIC STUDIES: Oxygen Saturation is 100% on RA, normal by my interpretation.    COORDINATION OF CARE: 10:12 PM-Discussed treatment plan which includes CT scan and labs with pt at bedside and pt agreed to plan.    Labs Review Labs Reviewed  URINALYSIS, ROUTINE W REFLEX MICROSCOPIC (NOT AT [facility]) - Abnormal; Notable for the following:    Color, Urine AMBER (*)    APPearance CLOUDY (*)    Bilirubin Urine SMALL (*)    Ketones, ur 15 (*)    Leukocytes, UA SMALL (*)    All other components within normal limits  BASIC METABOLIC PANEL - Abnormal; Notable for the following:    Glucose, Bld 114 (*)    All other components within normal limits  URINE MICROSCOPIC-ADD ON - Abnormal; Notable for the following:    Squamous Epithelial / LPF 0-5 (*)    Bacteria, UA MANY (*)    Crystals CA OXALATE CRYSTALS (*)    All other components within normal limits  URINE CULTURE  CBC WITH DIFFERENTIAL/PLATELET    Imaging Review Dg Lumbar Spine Complete  03/28/2016  CLINICAL DATA:  Patient states he was holding a big slab of concrete and he twisted his back and felt a sharp pain that goes from his lower back to his left leg. EXAM: LUMBAR SPINE - COMPLETE 4+ VIEW COMPARISON:  None. FINDINGS: No fracture.  No spondylolisthesis. Mild loss of disc height at L4-L5. Remaining disc spaces are relatively well preserved. There are small endplate osteophytes along the lower thoracic spine and, to lesser degree, along the lumbar spine. Facet joints are relatively well preserved. Soft tissues are unremarkable. IMPRESSION: 1. No fracture or acute finding. 2. Mild disc degenerative changes. Electronically Signed   By: Amie Portland M.D.   On: 03/28/2016 16:42   US Scrotum  03/28/2016  CLINICAL DATA:  Left scrotal pain for several days. EXAM: SCROTAL ULTRASOUND DOPPLER ULTRASOUND  OF THE TESTICLES TECHNIQUE: Complete ultrasound examination of the testicles, epididymis, and other scrotal structures was performed. Color and spectral Doppler ultrasound were also utilized to evaluate blood flow to the testicles. COMPARISON:  CT, 03/26/2016 FINDINGS: Right testicle Measurements: 4.2 x 1.8 x 2.7 cm. Small, 4 mm, tunica albuginea cyst. No scrotal mass or abnormal echogenicity. No microlithiasis. Left testicle Measurements: 4.0 x 2.0 x 2.6 cm. No mass or microlithiasis visualized. Right epididymis:  Normal in size and appearance. Left epididymis: 4 mm epididymal head cyst. Otherwise unremarkable. Hydrocele:  None visualized. Varicocele: Left-sided varicocele possibly the source of this patient's discomfort. No right varicocele. Pulsed Doppler interrogation of both testes demonstrates normal low resistance arterial and venous waveforms bilaterally. IMPRESSION: 1. Left-sided varicocele which could be a source of left-sided scrotal pain. 2. No testicular mass, evidence of torsion or evidence of epididymitis/orchitis.  3. Incidental note of a 4 mm right tunica albuginea cyst and 4 mm left epididymal head cyst. Electronically Signed   By: Amie Portlandavid  Ormond M.D.   On: 03/28/2016 15:58   [place] Art/ven Flow Abd Pelv Doppler  03/28/2016  CLINICAL DATA:  Left scrotal pain for several days. EXAM: SCROTAL ULTRASOUND DOPPLER ULTRASOUND OF THE TESTICLES TECHNIQUE: Complete ultrasound examination of the testicles, epididymis, and other scrotal structures was performed. Color and spectral Doppler ultrasound were also utilized to evaluate blood flow to the testicles. COMPARISON:  CT, 03/26/2016 FINDINGS: Right testicle Measurements: 4.2 x 1.8 x 2.7 cm. Small, 4 mm, tunica albuginea cyst. No scrotal mass or abnormal echogenicity. No microlithiasis. Left testicle Measurements: 4.0 x 2.0 x 2.6 cm. No mass or microlithiasis visualized. Right epididymis:  Normal in size and appearance. Left epididymis: 4 mm epididymal head  cyst. Otherwise unremarkable. Hydrocele:  None visualized. Varicocele: Left-sided varicocele possibly the source of this patient's discomfort. No right varicocele. Pulsed Doppler interrogation of both testes demonstrates normal low resistance arterial and venous waveforms bilaterally. IMPRESSION: 1. Left-sided varicocele which could be a source of left-sided scrotal pain. 2. No testicular mass, evidence of torsion or evidence of epididymitis/orchitis. 3. Incidental note of a 4 mm right tunica albuginea cyst and 4 mm left epididymal head cyst. Electronically Signed   By: Amie Portlandavid  Ormond M.D.   On: 03/28/2016 15:58   Ct Renal Stone Study  03/26/2016  CLINICAL DATA:  60 year old male with left flank pain radiating to the groin. EXAM: CT ABDOMEN AND PELVIS WITHOUT CONTRAST TECHNIQUE: Multidetector CT imaging of the abdomen and pelvis was performed following the standard protocol without IV contrast. COMPARISON:  Abdominal CT dated 12/29/2015 FINDINGS: Evaluation of this exam is limited in the absence of intravenous contrast. The visualized lung bases are clear. No intra-abdominal free air or free fluid. Cirrhosis. Stable 5.0 x 5.5 cm last low attenuating/cystic lesion in the right lobe of the liver, incompletely characterized on this noncontrast CT. The gallbladder, pancreas, and adrenal glands appear unremarkable. Top-normal spleen size measuring 14 cm in length. There is a 3 mm nonobstructing stone in the upper pole of the right kidney. No hydronephrosis. The left kidney is unremarkable. The visualized ureters and urinary bladder appear unremarkable. The prostate and seminal vesicles are grossly unremarkable. There is sigmoid diverticulosis without active inflammatory changes. There is no evidence of bowel obstruction or active inflammation. Normal appendix. There is mild aortoiliac atherosclerotic disease. No portal venous gas identified. There is no adenopathy. The abdominal wall soft tissues appear unremarkable.  There is degenerative changes of the spine. No acute fracture. IMPRESSION: A 3 mm nonobstructing right renal upper pole calculus. There is no hydronephrosis or obstructing stone on either side. Cirrhosis with mild splenomegaly. Sigmoid diverticulosis. No evidence of bowel obstruction or active inflammation. Normal appendix. Electronically Signed   By: Elgie CollardArash  Radparvar M.D.   On: 03/26/2016 21:53   I have personally reviewed and evaluated these images and lab results as part of my medical decision-making.   EKG Interpretation None      MDM  Patient with left flank and  Final diagnoses:  UTI (lower urinary tract infection)  Groin pain, left   Patient with flank and groin painon the left.  Ua with + crystals a nd bacteria+ pyuria. No hydronephrosis or signs of active ureteral stone. No signs of hernia. Or other acute abnormality. I personally performed the services described in this documentation, which was scribed in my presence. The recorded information has been  reviewed and is accurate.        Arthor Captain, PA-C 03/29/16 1908  Melene Plan, DO 03/29/16 1951

## 2016-03-26 NOTE — ED Notes (Signed)
Left flank pain radiates to groin x today-states feels like kidney stone

## 2016-03-26 NOTE — Discharge Instructions (Signed)

## 2016-03-26 NOTE — ED Notes (Signed)
Patient transported to X-ray 

## 2016-03-26 NOTE — ED Notes (Signed)
Pt verbalizes understanding of d/c instructions and denies any further needs at this time. 

## 2016-03-28 ENCOUNTER — Emergency Department (HOSPITAL_COMMUNITY)
Admission: EM | Admit: 2016-03-28 | Discharge: 2016-03-28 | Disposition: A | Discharge status: Home / Self Care | Payer: Self-pay | Attending: Emergency Medicine | Admitting: Emergency Medicine

## 2016-03-28 ENCOUNTER — Emergency Department (HOSPITAL_COMMUNITY): Payer: Self-pay

## 2016-03-28 ENCOUNTER — Encounter (HOSPITAL_COMMUNITY): Payer: Self-pay | Admitting: Emergency Medicine

## 2016-03-28 DIAGNOSIS — F329 Major depressive disorder, single episode, unspecified: Secondary | ICD-10-CM | POA: Insufficient documentation

## 2016-03-28 DIAGNOSIS — R1030 Lower abdominal pain, unspecified: Secondary | ICD-10-CM | POA: Insufficient documentation

## 2016-03-28 DIAGNOSIS — F17210 Nicotine dependence, cigarettes, uncomplicated: Secondary | ICD-10-CM | POA: Insufficient documentation

## 2016-03-28 DIAGNOSIS — I861 Scrotal varices: Secondary | ICD-10-CM

## 2016-03-28 DIAGNOSIS — M545 Low back pain, unspecified: Secondary | ICD-10-CM

## 2016-03-28 DIAGNOSIS — R52 Pain, unspecified: Secondary | ICD-10-CM

## 2016-03-28 LAB — COMPREHENSIVE METABOLIC PANEL
ALBUMIN: 4.6 g/dL (ref 3.5–5.0)
ALK PHOS: 103 U/L (ref 38–126)
ALT: 73 U/L — ABNORMAL HIGH (ref 17–63)
ANION GAP: 7 (ref 5–15)
AST: 76 U/L — ABNORMAL HIGH (ref 15–41)
BUN: 13 mg/dL (ref 6–20)
CALCIUM: 9.8 mg/dL (ref 8.9–10.3)
CO2: 22 mmol/L (ref 22–32)
Chloride: 108 mmol/L (ref 101–111)
Creatinine, Ser: 0.56 mg/dL — ABNORMAL LOW (ref 0.61–1.24)
GFR calc non Af Amer: >60 mL/min (ref >60)
GLUCOSE: 121 mg/dL — AB (ref 65–99)
POTASSIUM: 4.3 mmol/L (ref 3.5–5.1)
SODIUM: 137 mmol/L (ref 135–145)
Total Bilirubin: 1.2 mg/dL (ref 0.3–1.2)
Total Protein: 8.2 g/dL — ABNORMAL HIGH (ref 6.5–8.1)

## 2016-03-28 LAB — CBC WITH DIFFERENTIAL/PLATELET
BASOS PCT: 0 %
Basophils Absolute: 0 10*3/uL (ref 0.0–0.1)
EOS ABS: 0.1 10*3/uL (ref 0.0–0.7)
EOS PCT: 1 %
HCT: 46.3 % (ref 39.0–52.0)
Hemoglobin: 15.8 g/dL (ref 13.0–17.0)
LYMPHS ABS: 1.7 10*3/uL (ref 0.7–4.0)
Lymphocytes Relative: 18 %
MCH: 30 pg (ref 26.0–34.0)
MCHC: 34.1 g/dL (ref 30.0–36.0)
MCV: 88 fL (ref 78.0–100.0)
MONO ABS: 0.6 10*3/uL (ref 0.1–1.0)
MONOS PCT: 6 %
NEUTROS PCT: 75 %
Neutro Abs: 7 10*3/uL (ref 1.7–7.7)
PLATELETS: 204 10*3/uL (ref 150–400)
RBC: 5.26 MIL/uL (ref 4.22–5.81)
RDW: 13.8 % (ref 11.5–15.5)
WBC: 9.4 10*3/uL (ref 4.0–10.5)

## 2016-03-28 LAB — URINALYSIS, ROUTINE W REFLEX MICROSCOPIC
Bilirubin Urine: NEGATIVE
GLUCOSE, UA: NEGATIVE mg/dL
Hgb urine dipstick: NEGATIVE
KETONES UR: NEGATIVE mg/dL
LEUKOCYTES UA: NEGATIVE
NITRITE: NEGATIVE
PH: 5.5 (ref 5.0–8.0)
PROTEIN: NEGATIVE mg/dL
Specific Gravity, Urine: 1.019 (ref 1.005–1.030)

## 2016-03-28 LAB — URINE CULTURE
CULTURE: NO GROWTH
SPECIAL REQUESTS: NORMAL

## 2016-03-28 MED ORDER — PREDNISONE 10 MG PO TABS: 20.0000 mg | ORAL_TABLET | Freq: Every day | ORAL | Status: DC

## 2016-03-28 MED ORDER — HYDROMORPHONE HCL 1 MG/ML IJ SOLN
1.0000 mg | Freq: Once | INTRAMUSCULAR | Status: AC
Start: 2016-03-28 — End: 2016-03-28
  Administered 2016-03-28: 1 mg via INTRAVENOUS
  Filled 2016-03-28: qty 1

## 2016-03-28 MED ORDER — HYDROMORPHONE HCL 1 MG/ML IJ SOLN
1.0000 mg | Freq: Once | INTRAMUSCULAR | Status: AC
  Administered 2016-03-28: 1 mg via INTRAVENOUS
  Filled 2016-03-28: qty 1

## 2016-03-28 MED ORDER — METHYLPREDNISOLONE SODIUM SUCC 125 MG IJ SOLR
125.0000 mg | Freq: Once | INTRAMUSCULAR | Status: AC
  Administered 2016-03-28: 125 mg via INTRAVENOUS
  Filled 2016-03-28: qty 2

## 2016-03-28 MED ORDER — LORAZEPAM 2 MG/ML IJ SOLN
1.0000 mg | Freq: Once | INTRAMUSCULAR | Status: AC
  Administered 2016-03-28: 1 mg via INTRAVENOUS
  Filled 2016-03-28: qty 1

## 2016-03-28 MED ORDER — OXYCODONE-ACETAMINOPHEN 5-325 MG PO TABS: 1.0000 | ORAL_TABLET | Freq: Four times a day (QID) | ORAL | Status: DC | PRN

## 2016-03-28 MED ORDER — KETOROLAC TROMETHAMINE 30 MG/ML IJ SOLN
30.0000 mg | Freq: Once | INTRAMUSCULAR | Status: AC
  Administered 2016-03-28: 30 mg via INTRAVENOUS
  Filled 2016-03-28: qty 1

## 2016-03-28 MED ORDER — HYDROMORPHONE HCL 2 MG/ML IJ SOLN
2.0000 mg | Freq: Once | INTRAMUSCULAR | Status: AC
  Administered 2016-03-28: 2 mg via INTRAVENOUS
  Filled 2016-03-28: qty 1

## 2016-03-28 MED ORDER — ONDANSETRON HCL 4 MG/2ML IJ SOLN
4.0000 mg | Freq: Once | INTRAMUSCULAR | Status: AC
  Administered 2016-03-28: 4 mg via INTRAVENOUS
  Filled 2016-03-28: qty 2

## 2016-03-28 NOTE — ED Notes (Signed)
Patient presents for left lower back pain radiating to left groin. Patient reports went to pick up a slab of concrete and left pain shooting down left leg. Patient also c/o one week long intermittent difficulty urinating and dysuria. Denies hematuria, no loss of bladder of bowel.

## 2016-03-28 NOTE — ED Provider Notes (Signed)
CSN: [id number]     Arrival date & time 03/28/16  1339 History   First MD Initiated Contact with Patient 03/28/16 1445     Chief Complaint  Patient presents with  . Back Pain  . Groin Pain     (Consider location/radiation/quality/duration/timing/severity/associated sxs/prior Treatment) Patient is a 60 y.o. male presenting with back pain and groin pain. The history is provided by the patient (The patient states he was lifting a heavy object at work started having severe pain in his back radiating down his left leg. Patient also complains of pain in his left testicle for a few weeks).  Back Pain Location:  Lumbar spine Quality:  Aching Radiates to: Left lower leg. Pain severity:  Moderate Pain is:  Same all the time Onset quality:  Sudden Timing:  Constant Associated symptoms: no abdominal pain, no chest pain and no headaches   Groin Pain Pertinent negatives include no chest pain, no abdominal pain and no headaches.    Past Medical History  Diagnosis Date  . Renal disorder   . Kidney stones   . Anxiety   . Depression   . Opiate withdrawal (HCC) 11/09/2011  . Opiate abuse, episodic   . Alcohol abuse    Past Surgical History  Procedure Laterality Date  . L ankle surgery      Plates and screws placed last October at [facility]  . Reconstructive surgery r great toe     Family History  Problem Relation Age of Onset  . Heart failure Mother   . Anesthesia problems Neg Hx   . Hypotension Neg Hx   . Malignant hyperthermia Neg Hx   . Pseudochol deficiency Neg Hx    Social History  Substance Use Topics  . Smoking status: Current Every Day Smoker -- 0.50 packs/day for 15 years    Types: Cigarettes  . Smokeless tobacco: Current User    Types: Snuff     Comment: declined cessation material  . Alcohol Use: No    Review of Systems  Constitutional: Negative for appetite change and fatigue.  HENT: Negative for congestion, ear discharge and sinus pressure.   Eyes: Negative  for discharge.  Respiratory: Negative for cough.   Cardiovascular: Negative for chest pain.  Gastrointestinal: Negative for abdominal pain and diarrhea.  Genitourinary: Negative for frequency and hematuria.       Left testicle pain  Musculoskeletal: Positive for back pain.  Skin: Negative for rash.  Neurological: Negative for seizures and headaches.  Psychiatric/Behavioral: Negative for hallucinations.      Allergies  Morphine and related  Home Medications   Prior to Admission medications   Medication Sig Start Date End Date Taking? Authorizing Provider  cephALEXin (KEFLEX) 500 MG capsule Take 1 capsule (500 mg total) by mouth 4 (four) times daily. Patient not taking: Reported on 03/28/2016 03/26/16   Arthor Captain, PA-C  naproxen (NAPROSYN) 500 MG tablet Take 1 tablet (500 mg total) by mouth 2 (two) times daily. Patient not taking: Reported on 03/28/2016 03/26/16   Arthor Captain, PA-C  oxyCODONE-acetaminophen (PERCOCET) 5-325 MG tablet Take 1 tablet by mouth every 6 (six) hours as needed. 03/28/16   Bethann Berkshire, MD  predniSONE (DELTASONE) 10 MG tablet Take 2 tablets (20 mg total) by mouth daily. 03/28/16   Bethann Berkshire, MD   BP 139/81 mmHg  Pulse 69  Temp(Src) 98.2 F (36.8 C) (Oral)  Resp 17  SpO2 95% Physical Exam  Constitutional: He is oriented to person, place, and time. He appears  well-developed.  HENT:  Head: Normocephalic.  Eyes: Conjunctivae and EOM are normal. No scleral icterus.  Neck: Neck supple. No thyromegaly present.  Cardiovascular: Normal rate and regular rhythm.  Exam reveals no gallop and no friction rub.   No murmur heard. Pulmonary/Chest: No stridor. He has no wheezes. He has no rales. He exhibits no tenderness.  Abdominal: He exhibits no distension. There is no tenderness. There is no rebound.  Genitourinary:  Left testicle minor tenderness to palpation  Musculoskeletal: Normal range of motion. He exhibits no edema.  Patient has tenderness lumbar  spine with positive left leg raise  Lymphadenopathy:    He has no cervical adenopathy.  Neurological: He is oriented to person, place, and time. He exhibits normal muscle tone. Coordination normal.  Skin: No rash noted. No erythema.  Psychiatric: He has a normal mood and affect. His behavior is normal.    ED Course  Procedures (including critical care time) Labs Review Labs Reviewed  COMPREHENSIVE METABOLIC PANEL - Abnormal; Notable for the following:    Glucose, Bld 121 (*)    Creatinine, Ser 0.56 (*)    Total Protein 8.2 (*)    AST 76 (*)    ALT 73 (*)    All other components within normal limits  URINALYSIS, ROUTINE W REFLEX MICROSCOPIC (NOT AT [facility]) - Abnormal; Notable for the following:    Color, Urine AMBER (*)    All other components within normal limits  CBC WITH DIFFERENTIAL/PLATELET    Imaging Review Dg Lumbar Spine Complete  03/28/2016  CLINICAL DATA:  Patient states he was holding a big slab of concrete and he twisted his back and felt a sharp pain that goes from his lower back to his left leg. EXAM: LUMBAR SPINE - COMPLETE 4+ VIEW COMPARISON:  None. FINDINGS: No fracture.  No spondylolisthesis. Mild loss of disc height at L4-L5. Remaining disc spaces are relatively well preserved. There are small endplate osteophytes along the lower thoracic spine and, to lesser degree, along the lumbar spine. Facet joints are relatively well preserved. Soft tissues are unremarkable. IMPRESSION: 1. No fracture or acute finding. 2. Mild disc degenerative changes. Electronically Signed   By: Amie Portland M.D.   On: 03/28/2016 16:42   Dg Abd 1 View  03/26/2016  CLINICAL DATA:  Left flank pain difficulty urinating since this morning. EXAM: ABDOMEN - 1 VIEW COMPARISON:  CT abdomen pelvis December 28, 2013 FINDINGS: The bowel gas pattern is normal. No radio-opaque calculi or other significant radiographic abnormality are seen. There is left pelvic phlebolith unchanged compared to prior CT. There is  scoliosis of spine. IMPRESSION: No acute abnormality identified. Electronically Signed   By: Sherian Rein M.D.   On: 03/26/2016 20:42   US Scrotum  03/28/2016  CLINICAL DATA:  Left scrotal pain for several days. EXAM: SCROTAL ULTRASOUND DOPPLER ULTRASOUND OF THE TESTICLES TECHNIQUE: Complete ultrasound examination of the testicles, epididymis, and other scrotal structures was performed. Color and spectral Doppler ultrasound were also utilized to evaluate blood flow to the testicles. COMPARISON:  CT, 03/26/2016 FINDINGS: Right testicle Measurements: 4.2 x 1.8 x 2.7 cm. Small, 4 mm, tunica albuginea cyst. No scrotal mass or abnormal echogenicity. No microlithiasis. Left testicle Measurements: 4.0 x 2.0 x 2.6 cm. No mass or microlithiasis visualized. Right epididymis:  Normal in size and appearance. Left epididymis: 4 mm epididymal head cyst. Otherwise unremarkable. Hydrocele:  None visualized. Varicocele: Left-sided varicocele possibly the source of this patient's discomfort. No right varicocele. Pulsed Doppler interrogation of  both testes demonstrates normal low resistance arterial and venous waveforms bilaterally. IMPRESSION: 1. Left-sided varicocele which could be a source of left-sided scrotal pain. 2. No testicular mass, evidence of torsion or evidence of epididymitis/orchitis. 3. Incidental note of a 4 mm right tunica albuginea cyst and 4 mm left epididymal head cyst. Electronically Signed   By: Amie Portland M.D.   On: 03/28/2016 15:58   [place] Art/ven Flow Abd Pelv Doppler  03/28/2016  CLINICAL DATA:  Left scrotal pain for several days. EXAM: SCROTAL ULTRASOUND DOPPLER ULTRASOUND OF THE TESTICLES TECHNIQUE: Complete ultrasound examination of the testicles, epididymis, and other scrotal structures was performed. Color and spectral Doppler ultrasound were also utilized to evaluate blood flow to the testicles. COMPARISON:  CT, 03/26/2016 FINDINGS: Right testicle Measurements: 4.2 x 1.8 x 2.7 cm. Small, 4 mm,  tunica albuginea cyst. No scrotal mass or abnormal echogenicity. No microlithiasis. Left testicle Measurements: 4.0 x 2.0 x 2.6 cm. No mass or microlithiasis visualized. Right epididymis:  Normal in size and appearance. Left epididymis: 4 mm epididymal head cyst. Otherwise unremarkable. Hydrocele:  None visualized. Varicocele: Left-sided varicocele possibly the source of this patient's discomfort. No right varicocele. Pulsed Doppler interrogation of both testes demonstrates normal low resistance arterial and venous waveforms bilaterally. IMPRESSION: 1. Left-sided varicocele which could be a source of left-sided scrotal pain. 2. No testicular mass, evidence of torsion or evidence of epididymitis/orchitis. 3. Incidental note of a 4 mm right tunica albuginea cyst and 4 mm left epididymal head cyst. Electronically Signed   By: Amie Portland M.D.   On: 03/28/2016 15:58   Ct Renal Stone Study  03/26/2016  CLINICAL DATA:  60 year old male with left flank pain radiating to the groin. EXAM: CT ABDOMEN AND PELVIS WITHOUT CONTRAST TECHNIQUE: Multidetector CT imaging of the abdomen and pelvis was performed following the standard protocol without IV contrast. COMPARISON:  Abdominal CT dated 12/29/2015 FINDINGS: Evaluation of this exam is limited in the absence of intravenous contrast. The visualized lung bases are clear. No intra-abdominal free air or free fluid. Cirrhosis. Stable 5.0 x 5.5 cm last low attenuating/cystic lesion in the right lobe of the liver, incompletely characterized on this noncontrast CT. The gallbladder, pancreas, and adrenal glands appear unremarkable. Top-normal spleen size measuring 14 cm in length. There is a 3 mm nonobstructing stone in the upper pole of the right kidney. No hydronephrosis. The left kidney is unremarkable. The visualized ureters and urinary bladder appear unremarkable. The prostate and seminal vesicles are grossly unremarkable. There is sigmoid diverticulosis without active  inflammatory changes. There is no evidence of bowel obstruction or active inflammation. Normal appendix. There is mild aortoiliac atherosclerotic disease. No portal venous gas identified. There is no adenopathy. The abdominal wall soft tissues appear unremarkable. There is degenerative changes of the spine. No acute fracture. IMPRESSION: A 3 mm nonobstructing right renal upper pole calculus. There is no hydronephrosis or obstructing stone on either side. Cirrhosis with mild splenomegaly. Sigmoid diverticulosis. No evidence of bowel obstruction or active inflammation. Normal appendix. Electronically Signed   By: Elgie Collard M.D.   On: 03/26/2016 21:53   I have personally reviewed and evaluated these images and lab results as part of my medical decision-making.   EKG Interpretation None      MDM   Final diagnoses:  Back pain at L4-L5 level  Varicocele    Patient with lumbar spine pain with sciatica and left side. Patient given pain medicine and prednisone to take it home. Left testicle pain is  related to a  Varicocele.   Patient referred to orthopedics and urology. He is given a note be out of work for 3 days and no heavy lifting for a week    Bethann BerkshireJoseph Tadd Holtmeyer, MD 03/28/16 [phone]

## 2016-03-28 NOTE — Discharge Instructions (Signed)
Follow-up with Dr. Herma ArdHewitt Pickering for orthopedics for your back. Rest at home for 3 days do not do any lifting over 20 pounds for 1 week. Follow-up with Dr. Ronne BinningMcKenzie for your scrotal pain

## 2017-04-27 ENCOUNTER — Encounter (HOSPITAL_COMMUNITY): Payer: Self-pay

## 2017-04-27 ENCOUNTER — Emergency Department (HOSPITAL_COMMUNITY): Payer: Self-pay

## 2017-04-27 ENCOUNTER — Emergency Department (HOSPITAL_COMMUNITY)
Admission: EM | Admit: 2017-04-27 | Discharge: 2017-04-28 | Disposition: A | Discharge status: Home / Self Care | Payer: Self-pay | Attending: Emergency Medicine | Admitting: Emergency Medicine

## 2017-04-27 DIAGNOSIS — N50811 Right testicular pain: Secondary | ICD-10-CM | POA: Insufficient documentation

## 2017-04-27 DIAGNOSIS — F17210 Nicotine dependence, cigarettes, uncomplicated: Secondary | ICD-10-CM | POA: Insufficient documentation

## 2017-04-27 DIAGNOSIS — R109 Unspecified abdominal pain: Secondary | ICD-10-CM

## 2017-04-27 LAB — CBC WITH DIFFERENTIAL/PLATELET
BASOS ABS: 0 10*3/uL (ref 0.0–0.1)
Basophils Relative: 0 %
EOS PCT: 3 %
Eosinophils Absolute: 0.2 10*3/uL (ref 0.0–0.7)
HCT: 38.5 % — ABNORMAL LOW (ref 39.0–52.0)
Hemoglobin: 13.3 g/dL (ref 13.0–17.0)
Lymphocytes Relative: 25 %
Lymphs Abs: 2 10*3/uL (ref 0.7–4.0)
MCH: 29.3 pg (ref 26.0–34.0)
MCHC: 34.5 g/dL (ref 30.0–36.0)
MCV: 84.8 fL (ref 78.0–100.0)
MONO ABS: 0.5 10*3/uL (ref 0.1–1.0)
Monocytes Relative: 6 %
Neutro Abs: 5.2 10*3/uL (ref 1.7–7.7)
Neutrophils Relative %: 66 %
PLATELETS: 168 10*3/uL (ref 150–400)
RBC: 4.54 MIL/uL (ref 4.22–5.81)
RDW: 13.6 % (ref 11.5–15.5)
WBC: 7.9 10*3/uL (ref 4.0–10.5)

## 2017-04-27 LAB — URINALYSIS, ROUTINE W REFLEX MICROSCOPIC
BILIRUBIN URINE: NEGATIVE
Glucose, UA: NEGATIVE mg/dL
HGB URINE DIPSTICK: NEGATIVE
KETONES UR: NEGATIVE mg/dL
Leukocytes, UA: NEGATIVE
Nitrite: NEGATIVE
PROTEIN: NEGATIVE mg/dL
Specific Gravity, Urine: 1.023 (ref 1.005–1.030)
pH: 5 (ref 5.0–8.0)

## 2017-04-27 LAB — BASIC METABOLIC PANEL
ANION GAP: 5 (ref 5–15)
BUN: 18 mg/dL (ref 6–20)
CALCIUM: 8.9 mg/dL (ref 8.9–10.3)
CO2: 26 mmol/L (ref 22–32)
CREATININE: 0.63 mg/dL (ref 0.61–1.24)
Chloride: 110 mmol/L (ref 101–111)
GFR calc Af Amer: >60 mL/min (ref >60)
Glucose, Bld: 103 mg/dL — ABNORMAL HIGH (ref 65–99)
Potassium: 4.4 mmol/L (ref 3.5–5.1)
Sodium: 141 mmol/L (ref 135–145)

## 2017-04-27 MED ORDER — FENTANYL CITRATE (PF) 100 MCG/2ML IJ SOLN
100.0000 ug | Freq: Once | INTRAMUSCULAR | Status: AC
  Administered 2017-04-27: 100 ug via INTRAVENOUS
  Filled 2017-04-27: qty 2

## 2017-04-27 MED ORDER — ONDANSETRON HCL 4 MG/2ML IJ SOLN
4.0000 mg | Freq: Once | INTRAMUSCULAR | Status: AC
  Administered 2017-04-27: 4 mg via INTRAVENOUS
  Filled 2017-04-27: qty 2

## 2017-04-27 MED ORDER — ONDANSETRON HCL 8 MG PO TABS: 8.0000 mg | ORAL_TABLET | Freq: Three times a day (TID) | ORAL | 0 refills | Status: DC | PRN

## 2017-04-27 MED ORDER — FENTANYL CITRATE (PF) 100 MCG/2ML IJ SOLN
50.0000 ug | Freq: Once | INTRAMUSCULAR | Status: AC
  Administered 2017-04-27: 50 ug via INTRAVENOUS
  Filled 2017-04-27: qty 2

## 2017-04-27 MED ORDER — KETOROLAC TROMETHAMINE 30 MG/ML IJ SOLN
30.0000 mg | Freq: Once | INTRAMUSCULAR | Status: AC
  Administered 2017-04-27: 30 mg via INTRAVENOUS
  Filled 2017-04-27: qty 1

## 2017-04-27 MED ORDER — OXYCODONE-ACETAMINOPHEN 5-325 MG PO TABS: 1.0000 | ORAL_TABLET | ORAL | 0 refills | Status: DC | PRN

## 2017-04-27 MED ORDER — SODIUM CHLORIDE 0.9 % IV BOLUS (SEPSIS)
500.0000 mL | Freq: Once | INTRAVENOUS | Status: AC
  Administered 2017-04-27: 500 mL via INTRAVENOUS

## 2017-04-27 NOTE — ED Notes (Signed)
Pt reminded of need for urine specimen 

## 2017-04-27 NOTE — ED Triage Notes (Signed)
Patient presents with complain of right testicle pain and back pain. Pt state he started to noticed blood in the urine this after noon. Pt state he to ultram for pain at home with no relief. Pt rate his pain 10/10.

## 2017-04-27 NOTE — ED Notes (Signed)
Bed: WA13 Expected date:  Expected time:  Means of arrival:  Comments: RES B 

## 2017-04-27 NOTE — ED Notes (Signed)
Pt has urinal and made aware of need for urine specimen. 

## 2017-04-27 NOTE — Discharge Instructions (Signed)
Tests showed no obvious kidney stone or testicular problem. Prescription for pain and nausea medication. Follow-up with urology.

## 2017-04-27 NOTE — ED Notes (Signed)
Dr.Cook notified of pt requesting more pain medications. Dr.Cook to speak with pt.

## 2017-04-29 NOTE — ED Provider Notes (Signed)
[facility]-EMERGENCY DEPT Provider Note   CSN: [id number] Arrival date & time: 04/27/17  1707     History   Chief Complaint No chief complaint on file.   HPI Darren Mendoza is a 61 y.o. male.  Patient presents with acute onset of right flank pain radiating to the right testicle. He claims to have a long history of kidney stones.  No dysuria, frequency, fever, chills.he does claim hematuria. He took Ultram at home with no relief.Severity of pain is moderate to severe.      Past Medical History:  Diagnosis Date  . Alcohol abuse   . Anxiety   . Depression   . Kidney stones   . Opiate abuse, episodic   . Opiate withdrawal (HCC) 11/09/2011  . Renal disorder     Patient Active Problem List   Diagnosis Date Noted  . Opioid withdrawal (HCC)   . Alcohol withdrawal syndrome with perceptual disturbance (HCC)   . 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 Substance-induced psychotic disorder with hallucinations (HCC)   . Opioid use disorder, severe, dependence (HCC) 08/22/2014  . Alcohol use disorder, severe, dependence (HCC) 08/22/2014  . Alcohol withdrawal with perceptual disturbances (HCC) 08/22/2014  . Depression 08/19/2014  . Substance induced mood disorder (HCC)   . Opiate withdrawal (HCC) 11/09/2011  . Opiate addiction (HCC) 11/07/2011    Class: Acute  . Homeless 11/07/2011    Class: Chronic  . Alcohol dependence (HCC) 11/07/2011    Class: Acute    Past Surgical History:  Procedure Laterality Date  . L ankle surgery     Plates and screws placed last October at [facility]  . Reconstructive surgery R great toe         Home Medications    Prior to Admission medications   Medication Sig Start Date End Date Taking? Authorizing Provider  ibuprofen (ADVIL,MOTRIN) 200 MG tablet Take 600 mg by mouth every 6 (six) hours as needed for headache or moderate pain.   Yes [provider]  cephALEXin (KEFLEX) 500 MG  capsule Take 1 capsule (500 mg total) by mouth 4 (four) times daily. Patient not taking: Reported on 04/27/2017 03/26/16   Arthor CaptainHarris, Abigail, PA-C  naproxen (NAPROSYN) 500 MG tablet Take 1 tablet (500 mg total) by mouth 2 (two) times daily. Patient not taking: Reported on 03/28/2016 03/26/16   Arthor CaptainHarris, Abigail, PA-C  ondansetron (ZOFRAN) 8 MG tablet Take 1 tablet (8 mg total) by mouth every 8 (eight) hours as needed for nausea or vomiting. 04/27/17   Donnetta Hutchingook, Chrishawna Farina, MD  oxyCODONE-acetaminophen (PERCOCET/ROXICET) 5-325 MG tablet Take 1-2 tablets by mouth every 4 (four) hours as needed for severe pain. 04/27/17   Donnetta Hutchingook, Ignace Mandigo, MD  predniSONE (DELTASONE) 10 MG tablet Take 2 tablets (20 mg total) by mouth daily. Patient not taking: Reported on 04/27/2017 03/28/16   Bethann BerkshireZammit, Joseph, MD    Family History Family History  Problem Relation Age of Onset  . Heart failure Mother   . Anesthesia problems Neg Hx   . Hypotension Neg Hx   . Malignant hyperthermia Neg Hx   . Pseudochol deficiency Neg Hx     Social History Social History  Substance Use Topics  . Smoking status: Current Every Day Smoker    Packs/day: 0.50    Years: 15.00    Types: Cigarettes  . Smokeless tobacco: Current User    Types: Snuff     Comment: declined cessation material  . Alcohol use No  Allergies   Morphine and related   Review of Systems Review of Systems  All other systems reviewed and are negative.    Physical Exam Updated Vital Signs BP (!) 143/78 (BP Location: Left Arm)   Pulse 61   Temp 97.6 F (36.4 C) (Oral)   Resp 18   Ht 5\' 9"  (1.753 m)   Wt 108.9 kg (240 lb)   SpO2 99%   BMI 35.44 kg/m   Physical Exam  Constitutional: He is oriented to person, place, and time.  Appears to be in pain.  HENT:  Head: Normocephalic and atraumatic.  Eyes: Conjunctivae are normal.  Neck: Neck supple.  Cardiovascular: Normal rate and regular rhythm.   Pulmonary/Chest: Effort normal and breath sounds normal.    Abdominal: Soft. Bowel sounds are normal.  Genitourinary:  Genitourinary Comments: Minimal right flank tenderness. Minimal right testicular tenderness. No masses palpated.  Musculoskeletal: Normal range of motion.  Neurological: He is alert and oriented to person, place, and time.  Skin: Skin is warm and dry.  Psychiatric: He has a normal mood and affect. His behavior is normal.  Nursing note and vitals reviewed.    ED Treatments / Results  Labs (all labs ordered are listed, but only abnormal results are displayed) Labs Reviewed  CBC WITH DIFFERENTIAL/PLATELET - Abnormal; Notable for the following:       Result Value   HCT 38.5 (*)    All other components within normal limits  BASIC METABOLIC PANEL - Abnormal; Notable for the following:    Glucose, Bld 103 (*)    All other components within normal limits  URINALYSIS, ROUTINE W REFLEX MICROSCOPIC    EKG  EKG Interpretation None       Radiology US Scrotum  Result Date: 04/27/2017 CLINICAL DATA:  Acute onset of right testicular pain. Hematuria. Initial encounter. EXAM: SCROTAL ULTRASOUND DOPPLER ULTRASOUND OF THE TESTICLES TECHNIQUE: Complete ultrasound examination of the testicles, epididymis, and other scrotal structures was performed. Color and spectral Doppler ultrasound were also utilized to evaluate blood flow to the testicles. COMPARISON:  Scrotal ultrasound performed 03/28/2016 FINDINGS: Right testicle Measurements: 4.3 x 2.0 x 2.7 cm. A small 0.5 cm cyst is noted at the periphery of the right testis. No microlithiasis seen. Left testicle Measurements: 4.1 x 2.0 x 2.7 cm. No mass or microlithiasis visualized. Right epididymis:  Normal in size and appearance. Left epididymis:  Normal in size and appearance. Hydrocele:  None visualized. Varicocele:  None visualized. Pulsed Doppler interrogation of both testes demonstrates normal low resistance arterial and venous waveforms bilaterally. IMPRESSION: 1. No evidence of testicular  torsion. 2. Small right-sided testicular cyst incidentally noted. Electronically Signed   By: Roanna Raider M.D.   On: 04/27/2017 23:56   [place] Art/ven Flow Abd Pelv Doppler Limited  Result Date: 04/27/2017 CLINICAL DATA:  Acute onset of right testicular pain. Hematuria. Initial encounter. EXAM: SCROTAL ULTRASOUND DOPPLER ULTRASOUND OF THE TESTICLES TECHNIQUE: Complete ultrasound examination of the testicles, epididymis, and other scrotal structures was performed. Color and spectral Doppler ultrasound were also utilized to evaluate blood flow to the testicles. COMPARISON:  Scrotal ultrasound performed 03/28/2016 FINDINGS: Right testicle Measurements: 4.3 x 2.0 x 2.7 cm. A small 0.5 cm cyst is noted at the periphery of the right testis. No microlithiasis seen. Left testicle Measurements: 4.1 x 2.0 x 2.7 cm. No mass or microlithiasis visualized. Right epididymis:  Normal in size and appearance. Left epididymis:  Normal in size and appearance. Hydrocele:  None visualized. Varicocele:  None  visualized. Pulsed Doppler interrogation of both testes demonstrates normal low resistance arterial and venous waveforms bilaterally. IMPRESSION: 1. No evidence of testicular torsion. 2. Small right-sided testicular cyst incidentally noted. Electronically Signed   By: Roanna Raider M.D.   On: 04/27/2017 23:56   Ct Renal Stone Study  Result Date: 04/27/2017 CLINICAL DATA:  Right testicular pain. Back pain. Blood in urine beginning this afternoon. EXAM: CT ABDOMEN AND PELVIS WITHOUT CONTRAST TECHNIQUE: Multidetector CT imaging of the abdomen and pelvis was performed following the standard protocol without IV contrast. COMPARISON:  March 26, 2016 FINDINGS: Lower chest: No acute abnormality. Hepatobiliary: There is a large cyst in the liver. There is a nodular contour to the liver consistent with cirrhosis. A few tiny low-attenuation lesions in the liver are unchanged and incompletely characterized without contrast. The gallbladder  is normal. Pancreas: Unremarkable. No pancreatic ductal dilatation or surrounding inflammatory changes. Spleen: The spleen measures 15 cm in cranial caudal dimension, similar in the interval, suggesting mild splenomegaly. No splenic masses identified. Adrenals/Urinary Tract: The adrenal glands are normal. Two tiny stones are seen in the right kidney and a 4 mm stone is seen in the lower pole of the left kidney. No hydronephrosis or perinephric stranding. The right ureter is normal in caliber with no stone identified. No left-sided ureteral stones identified either the bladder is normal. Stomach/Bowel: The stomach and small bowel are normal. A few scattered colonic diverticuli are seen without diverticulitis. Mild fecal loading is seen in the right side of the colon. The appendix is well seen with no evidence of appendicitis. Vascular/Lymphatic: Atherosclerotic changes are seen in the non aneurysmal aorta and branching vessels. No adenopathy. Reproductive: Prostate is unremarkable. Other: No free air or free fluid.  No other acute abnormalities. Musculoskeletal: No acute or significant osseous findings. IMPRESSION: 1. Nonobstructive stones in both kidneys. No ureteral stones or obstruction. 2. Atherosclerotic change in the non aneurysmal aorta. 3. Signs of cirrhosis.  Prominent spleen as above. Aortic Atherosclerosis (ICD10-I70.0). Electronically Signed   By: Gerome Sam III M.D   On: 04/27/2017 19:33    Procedures Procedures (including critical care time)  Medications Ordered in ED Medications  ondansetron [facility]) injection 4 mg (4 mg Intravenous Given 04/27/17 1830)  sodium chloride 0.9 % bolus 500 mL (0 mLs Intravenous Stopped 04/27/17 1956)  ketorolac (TORADOL) 30 MG/ML injection 30 mg (30 mg Intravenous Given 04/27/17 1830)  fentaNYL (SUBLIMAZE) injection 100 mcg (100 mcg Intravenous Given 04/27/17 1831)  fentaNYL (SUBLIMAZE) injection 100 mcg (100 mcg Intravenous Given 04/27/17 1903)  fentaNYL  (SUBLIMAZE) injection 100 mcg (100 mcg Intravenous Given 04/27/17 2017)  fentaNYL (SUBLIMAZE) injection 50 mcg (50 mcg Intravenous Given 04/27/17 2220)     Initial Impression / Assessment and Plan / ED Course  I have reviewed the triage vital signs and the nursing notes.  Pertinent labs & imaging results that were available during my care of the patient were reviewed by me and considered in my medical decision making (see chart for details).     Patient presents with history and physical consistent with renal colic. However CT renal shows reveals no ureteral stones. Additionally, I ordered a testicular ultrasound. This was normal.  Patient was treated for pain and referred to urology.  Final Clinical Impressions(s) / ED Diagnoses   Final diagnoses:  Right testicular pain  Right flank pain    New Prescriptions Discharge Medication List as of 04/27/2017 11:50 PM    START taking these medications   Details  ondansetron (  ZOFRAN) 8 MG tablet Take 1 tablet (8 mg total) by mouth every 8 (eight) hours as needed for nausea or vomiting., Starting Sun 04/27/2017, Print         Donnetta Hutching, MD 04/29/17 [phone]

## 2017-08-25 ENCOUNTER — Emergency Department (HOSPITAL_COMMUNITY)
Admission: EM | Admit: 2017-08-25 | Discharge: 2017-08-26 | Disposition: A | Discharge status: Home / Self Care | Payer: Self-pay | Attending: Emergency Medicine | Admitting: Emergency Medicine

## 2017-08-25 ENCOUNTER — Encounter (HOSPITAL_COMMUNITY): Payer: Self-pay | Admitting: *Deleted

## 2017-08-25 ENCOUNTER — Emergency Department (HOSPITAL_COMMUNITY): Payer: Self-pay

## 2017-08-25 DIAGNOSIS — N451 Epididymitis: Secondary | ICD-10-CM | POA: Insufficient documentation

## 2017-08-25 DIAGNOSIS — R109 Unspecified abdominal pain: Secondary | ICD-10-CM | POA: Insufficient documentation

## 2017-08-25 DIAGNOSIS — N50819 Testicular pain, unspecified: Secondary | ICD-10-CM

## 2017-08-25 DIAGNOSIS — N50812 Left testicular pain: Secondary | ICD-10-CM | POA: Insufficient documentation

## 2017-08-25 DIAGNOSIS — F17210 Nicotine dependence, cigarettes, uncomplicated: Secondary | ICD-10-CM | POA: Insufficient documentation

## 2017-08-25 DIAGNOSIS — Z79899 Other long term (current) drug therapy: Secondary | ICD-10-CM | POA: Insufficient documentation

## 2017-08-25 LAB — URINALYSIS, ROUTINE W REFLEX MICROSCOPIC
BILIRUBIN URINE: NEGATIVE
Glucose, UA: NEGATIVE mg/dL
HGB URINE DIPSTICK: NEGATIVE
KETONES UR: NEGATIVE mg/dL
Leukocytes, UA: NEGATIVE
Nitrite: NEGATIVE
PH: 5 (ref 5.0–8.0)
Protein, ur: NEGATIVE mg/dL
SPECIFIC GRAVITY, URINE: 1.018 (ref 1.005–1.030)

## 2017-08-25 MED ORDER — FENTANYL CITRATE (PF) 100 MCG/2ML IJ SOLN: 100.0000 ug | Freq: Once | INTRAMUSCULAR | Status: DC

## 2017-08-25 MED ORDER — FENTANYL CITRATE (PF) 100 MCG/2ML IJ SOLN
100.0000 ug | Freq: Once | INTRAMUSCULAR | Status: AC
  Administered 2017-08-25: 100 ug via INTRAMUSCULAR
  Filled 2017-08-25: qty 2

## 2017-08-25 MED ORDER — FENTANYL CITRATE (PF) 100 MCG/2ML IJ SOLN
50.0000 ug | Freq: Once | INTRAMUSCULAR | Status: AC
  Administered 2017-08-25: 50 ug via INTRAMUSCULAR
  Filled 2017-08-25: qty 2

## 2017-08-25 NOTE — ED Notes (Signed)
Pt notified for need for urine sample. Will let staff member know when he is able to provide sample.

## 2017-08-25 NOTE — ED Provider Notes (Signed)
[facility] EMERGENCY DEPARTMENT Provider Note   CSN: [id number] Arrival date & time: 08/25/17  2110     History   Chief Complaint Chief Complaint  Patient presents with  . Flank Pain    hx of kidney stones    HPI Darren Mendoza is a 61 y.o. male.  The history is provided by the patient. No language interpreter was used.  Flank Pain  This is a new problem. The current episode started 6 to 12 hours ago. The problem occurs constantly. The problem has been gradually worsening. Associated symptoms include abdominal pain. Nothing aggravates the symptoms. Nothing relieves the symptoms. He has tried nothing for the symptoms.  Pt complains of severe flank pain that radiates to groin.    Past Medical History:  Diagnosis Date  . Alcohol abuse   . Anxiety   . Depression   . Kidney stones   . Opiate abuse, episodic (HCC)   . Opiate withdrawal (HCC) 11/09/2011  . Renal disorder     Patient Active Problem List   Diagnosis Date Noted  . Opioid withdrawal (HCC)   . Alcohol withdrawal syndrome with perceptual disturbance (HCC)   . 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 Substance-induced psychotic disorder with hallucinations (HCC)   . Opioid use disorder, severe, dependence (HCC) 08/22/2014  . Alcohol use disorder, severe, dependence (HCC) 08/22/2014  . Alcohol withdrawal with perceptual disturbances (HCC) 08/22/2014  . Depression 08/19/2014  . Substance induced mood disorder (HCC)   . Opiate withdrawal (HCC) 11/09/2011  . Opiate addiction (HCC) 11/07/2011    Class: Acute  . Homeless 11/07/2011    Class: Chronic  . Alcohol dependence (HCC) 11/07/2011    Class: Acute    Past Surgical History:  Procedure Laterality Date  . L ankle surgery     Plates and screws placed last October at [facility]  . Reconstructive surgery R great toe         Home Medications    Prior to Admission medications   Medication Sig Start Date  End Date Taking? Authorizing Provider  acetaminophen (TYLENOL) 500 MG tablet Take 500 mg by mouth every 6 (six) hours as needed for mild pain or moderate pain.   Yes [provider]    Family History Family History  Problem Relation Age of Onset  . Heart failure Mother   . Anesthesia problems Neg Hx   . Hypotension Neg Hx   . Malignant hyperthermia Neg Hx   . Pseudochol deficiency Neg Hx     Social History Social History   Tobacco Use  . Smoking status: Current Every Day Smoker    Packs/day: 0.50    Years: 15.00    Pack years: 7.50    Types: Cigarettes  . Smokeless tobacco: Current User    Types: Snuff  . Tobacco comment: declined cessation material  Substance Use Topics  . Alcohol use: No  . Drug use: No     Allergies   Morphine and related   Review of Systems Review of Systems  Gastrointestinal: Positive for abdominal pain.  Genitourinary: Positive for flank pain.  All other systems reviewed and are negative.    Physical Exam Updated Vital Signs BP (!) 168/94 (BP Location: Left Arm)   Pulse 93   Temp 98.4 F (36.9 C) (Oral)   Resp 18   Ht 5\' 9"  (1.753 m)   Wt 104.3 kg (230 lb)   SpO2 100%   BMI 33.97  kg/m   Physical Exam  Constitutional: He appears well-developed and well-nourished.  HENT:  Head: Normocephalic and atraumatic.  Eyes: Pupils are equal, round, and reactive to light.  Neck: Neck supple.  Cardiovascular: Normal rate and regular rhythm.  No murmur heard. Pulmonary/Chest: Effort normal and breath sounds normal. No respiratory distress.  Abdominal: Soft. There is no tenderness.  Genitourinary: Penis normal.  Genitourinary Comments: Tender left scrotum, no erythema, no swelling, no deformity.   Musculoskeletal: He exhibits no edema.  Neurological: He is alert.  Skin: Skin is warm and dry.  Psychiatric: He has a normal mood and affect.  Nursing note and vitals reviewed.    ED Treatments / Results  Labs (all labs ordered  are listed, but only abnormal results are displayed) Labs Reviewed  URINALYSIS, ROUTINE W REFLEX MICROSCOPIC    EKG  EKG Interpretation None       Radiology Ct Renal Stone Study  Result Date: 08/25/2017 CLINICAL DATA:  Left flank and testicle pain EXAM: CT ABDOMEN AND PELVIS WITHOUT CONTRAST TECHNIQUE: Multidetector CT imaging of the abdomen and pelvis was performed following the standard protocol without IV contrast. COMPARISON:  CT abdomen pelvis 04/27/2017 FINDINGS: Lower chest: No pulmonary nodules or pleural effusion. No visible pericardial effusion. Hepatobiliary: Hepatic contour is diffusely nodular. Large cyst in the right hepatic lobe is unchanged, measuring 6 cm. Gallbladder is distended. Pancreas: Normal contours without ductal dilatation. No peripancreatic fluid collection. Spleen: There is splenomegaly, measuring 16 cm in craniocaudal dimension. Adrenals/Urinary Tract: --Adrenal glands: Normal. --Right kidney/ureter: No hydronephrosis or perinephric stranding. No nephrolithiasis. No obstructing ureteral stones. --Left kidney/ureter: No hydronephrosis or perinephric stranding. Nonobstructive 2 mm stone near the left lower pole. No obstructing ureteral stones. --Urinary bladder: Unremarkable. Stomach/Bowel: --Stomach/Duodenum: No hiatal hernia or other gastric abnormality. Normal duodenal course and caliber. --Small bowel: No dilatation or inflammation. --Colon: No focal abnormality. --Appendix: Normal. Vascular/Lymphatic: Atherosclerotic calcification is present within the non-aneurysmal abdominal aorta, without hemodynamically significant stenosis. No abdominal or pelvic lymphadenopathy. Reproductive: Normal prostate and seminal vesicles. Musculoskeletal. No bony spinal canal stenosis or focal osseous abnormality. Other: None. IMPRESSION: 1. No obstructive uropathy or other acute abdominopelvic abnormality. 2. Hepatic cirrhosis and splenomegaly, likely indicating underlying portal  hypertension. 3. Nonobstructive 2 mm left lower pole renal calculus. Electronically Signed   By: Deatra RobinsonKevin  Herman M.D.   On: 08/25/2017 22:46    Procedures Procedures (including critical care time)  Medications Ordered in ED Medications  fentaNYL (SUBLIMAZE) injection 100 mcg (100 mcg Intramuscular Given 08/25/17 2228)     Initial Impression / Assessment and Plan / ED Course  I have reviewed the triage vital signs and the nursing notes.  Pertinent labs & imaging results that were available during my care of the patient were reviewed by me and considered in my medical decision making (see chart for details).     Pt has no evidence of torsion. Ct no acute abnormality.   Pt reports he currently not having problems with substance abuse.  Pt reports some relief with pain medication.  Pt given torodol to help with pain.   Final Clinical Impressions(s) / ED Diagnoses   Final diagnoses:  Flank pain  Left testicular pain  Epididymitis    ED Discharge Orders        Ordered    ciprofloxacin (CIPRO) 500 MG tablet  2 times daily     08/26/17 0024    diclofenac (VOLTAREN) 50 MG EC tablet  2 times daily     08/26/17 0025  Follow up with urology.    Osie CheeksSofia, Hussein Macdougal K, PA-C 08/26/17 0113    Glynn Octaveancour, Stephen, MD 08/27/17 2008

## 2017-08-25 NOTE — ED Triage Notes (Signed)
Pt with left flank pain since earlier today, +N/V x2 per pt.  Pt states hx of kidney stones.

## 2017-08-26 ENCOUNTER — Emergency Department (HOSPITAL_COMMUNITY): Payer: Self-pay

## 2017-08-26 LAB — RAPID URINE DRUG SCREEN, HOSP PERFORMED
AMPHETAMINES: NOT DETECTED
BENZODIAZEPINES: POSITIVE — AB
Barbiturates: NOT DETECTED
COCAINE: NOT DETECTED
OPIATES: POSITIVE — AB
Tetrahydrocannabinol: POSITIVE — AB

## 2017-08-26 MED ORDER — KETOROLAC TROMETHAMINE 60 MG/2ML IM SOLN
60.0000 mg | Freq: Once | INTRAMUSCULAR | Status: AC
  Administered 2017-08-26: 60 mg via INTRAMUSCULAR
  Filled 2017-08-26: qty 2

## 2017-08-26 MED ORDER — CIPROFLOXACIN HCL 500 MG PO TABS: 500.0000 mg | ORAL_TABLET | Freq: Two times a day (BID) | ORAL | 0 refills | Status: DC

## 2017-08-26 MED ORDER — ACETAMINOPHEN 500 MG PO TABS
1000.0000 mg | ORAL_TABLET | Freq: Once | ORAL | Status: AC
  Administered 2017-08-26: 1000 mg via ORAL
  Filled 2017-08-26: qty 2

## 2017-08-26 MED ORDER — DICLOFENAC SODIUM 50 MG PO TBEC: 50.0000 mg | DELAYED_RELEASE_TABLET | Freq: Two times a day (BID) | ORAL | 0 refills | Status: DC

## 2017-08-26 MED ORDER — ACETAMINOPHEN 325 MG PO TABS: 650.0000 mg | ORAL_TABLET | Freq: Four times a day (QID) | ORAL | Status: DC | PRN

## 2017-08-26 NOTE — ED Notes (Signed)
Pt alert & oriented x4, stable gait. Patient given discharge instructions, paperwork & prescription(s). Patient  instructed to stop at the registration desk to finish any additional paperwork. Patient verbalized understanding. Pt left department w/ no further questions. 

## 2017-08-26 NOTE — Discharge Instructions (Signed)
Return if any problems. See your urologist for recheck

## 2017-09-07 ENCOUNTER — Other Ambulatory Visit: Payer: Self-pay

## 2017-09-07 ENCOUNTER — Emergency Department: Payer: Self-pay

## 2017-09-07 ENCOUNTER — Emergency Department
Admission: EM | Admit: 2017-09-07 | Discharge: 2017-09-07 | Disposition: A | Discharge status: Home / Self Care | Payer: Self-pay | Attending: Emergency Medicine | Admitting: Emergency Medicine

## 2017-09-07 ENCOUNTER — Encounter: Payer: Self-pay | Admitting: Emergency Medicine

## 2017-09-07 DIAGNOSIS — Z79899 Other long term (current) drug therapy: Secondary | ICD-10-CM | POA: Insufficient documentation

## 2017-09-07 DIAGNOSIS — R079 Chest pain, unspecified: Secondary | ICD-10-CM | POA: Insufficient documentation

## 2017-09-07 DIAGNOSIS — F17210 Nicotine dependence, cigarettes, uncomplicated: Secondary | ICD-10-CM | POA: Insufficient documentation

## 2017-09-07 LAB — URINE DRUG SCREEN, QUALITATIVE (ARMC ONLY)
AMPHETAMINES, UR SCREEN: NOT DETECTED
Barbiturates, Ur Screen: NOT DETECTED
Benzodiazepine, Ur Scrn: POSITIVE — AB
COCAINE METABOLITE, UR ~~LOC~~: NOT DETECTED
Cannabinoid 50 Ng, Ur ~~LOC~~: NOT DETECTED
MDMA (ECSTASY) UR SCREEN: NOT DETECTED
METHADONE SCREEN, URINE: NOT DETECTED
OPIATE, UR SCREEN: NOT DETECTED
PHENCYCLIDINE (PCP) UR S: NOT DETECTED
Tricyclic, Ur Screen: NOT DETECTED

## 2017-09-07 LAB — CBC WITH DIFFERENTIAL/PLATELET
BASOS ABS: 0.1 10*3/uL (ref 0–0.1)
Basophils Relative: 1 %
Eosinophils Absolute: 0.2 10*3/uL (ref 0–0.7)
Eosinophils Relative: 4 %
HEMATOCRIT: 42.6 % (ref 40.0–52.0)
HEMOGLOBIN: 14 g/dL (ref 13.0–18.0)
LYMPHS PCT: 24 %
Lymphs Abs: 1.5 10*3/uL (ref 1.0–3.6)
MCH: 29.1 pg (ref 26.0–34.0)
MCHC: 32.8 g/dL (ref 32.0–36.0)
MCV: 88.7 fL (ref 80.0–100.0)
MONO ABS: 0.6 10*3/uL (ref 0.2–1.0)
Monocytes Relative: 9 %
NEUTROS ABS: 3.8 10*3/uL (ref 1.4–6.5)
NEUTROS PCT: 62 %
Platelets: 143 10*3/uL — ABNORMAL LOW (ref 150–440)
RBC: 4.81 MIL/uL (ref 4.40–5.90)
RDW: 14.6 % — ABNORMAL HIGH (ref 11.5–14.5)
WBC: 6.2 10*3/uL (ref 3.8–10.6)

## 2017-09-07 LAB — COMPREHENSIVE METABOLIC PANEL
ALT: 30 U/L (ref 17–63)
AST: 33 U/L (ref 15–41)
Albumin: 3.5 g/dL (ref 3.5–5.0)
Alkaline Phosphatase: 83 U/L (ref 38–126)
Anion gap: 7 (ref 5–15)
BUN: 9 mg/dL (ref 6–20)
CHLORIDE: 107 mmol/L (ref 101–111)
CO2: 26 mmol/L (ref 22–32)
CREATININE: 0.64 mg/dL (ref 0.61–1.24)
Calcium: 8.8 mg/dL — ABNORMAL LOW (ref 8.9–10.3)
GFR calc non Af Amer: >60 mL/min (ref >60)
Glucose, Bld: 96 mg/dL (ref 65–99)
POTASSIUM: 4.6 mmol/L (ref 3.5–5.1)
SODIUM: 140 mmol/L (ref 135–145)
Total Bilirubin: 0.5 mg/dL (ref 0.3–1.2)
Total Protein: 6.6 g/dL (ref 6.5–8.1)

## 2017-09-07 LAB — ETHANOL: Alcohol, Ethyl (B): <10 mg/dL (ref <10)

## 2017-09-07 LAB — TROPONIN I: Troponin I: <0.03 ng/mL (ref <0.03)

## 2017-09-07 MED ORDER — KETOROLAC TROMETHAMINE 30 MG/ML IJ SOLN
30.0000 mg | Freq: Once | INTRAMUSCULAR | Status: AC
  Administered 2017-09-07: 30 mg via INTRAVENOUS
  Filled 2017-09-07: qty 1

## 2017-09-07 MED ORDER — HYDROMORPHONE HCL 1 MG/ML IJ SOLN
0.5000 mg | Freq: Once | INTRAMUSCULAR | Status: AC
  Administered 2017-09-07: 0.5 mg via INTRAVENOUS
  Filled 2017-09-07: qty 1

## 2017-09-07 MED ORDER — LORAZEPAM 2 MG/ML IJ SOLN
1.0000 mg | Freq: Once | INTRAMUSCULAR | Status: AC
  Administered 2017-09-07: 1 mg via INTRAVENOUS
  Filled 2017-09-07: qty 1

## 2017-09-07 MED ORDER — DIAZEPAM 5 MG PO TABS
5.0000 mg | ORAL_TABLET | Freq: Once | ORAL | Status: AC
  Administered 2017-09-07: 5 mg via ORAL
  Filled 2017-09-07: qty 1

## 2017-09-07 NOTE — ED Notes (Signed)
[facility] with 2 negative troponin's. Pt requesting iv valium - got dilaudid. Iv d/c'd

## 2017-09-07 NOTE — ED Notes (Signed)
Pt c/o pain and reports the only thing that works for him is valium. MD notified.

## 2017-09-07 NOTE — ED Provider Notes (Signed)
[facility] Emergency Department Provider Note ____________________________________________   I have reviewed the triage vital signs and the triage nursing note.  HISTORY  Chief Complaint Chest Pain   Historian Patient  HPI Darren Mendoza is a 61 y.o. male presents from [facility] where he is being treated for alcohol detox, states that this morning he started having central chest heaviness that feels like an elephant on his chest.  He also feels like he is having tingling and seeing colored spots including blue and red and yellow.  He feels a little short of breath.  He states he is "worried "because his family members have all had heart attacks.  A few weeks ago he had an episode where both legs were swollen, but that went down on its own.  He did not have a cardiac evaluation at that point time.  States he has stress test back in 2010 which was considered reassuring or normal.  Denies GERD-like symptoms.  Received 3 sprays of nitroglycerin in route and states there was no improvement at all.   Past Medical History:  Diagnosis Date  . Alcohol abuse   . Anxiety   . Depression   . Kidney stones   . Opiate abuse, episodic (HCC)   . Opiate withdrawal (HCC) 11/09/2011  . Renal disorder     Patient Active Problem List   Diagnosis Date Noted  . Opioid withdrawal (HCC)   . Alcohol withdrawal syndrome with perceptual disturbance (HCC)   . 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 Substance-induced psychotic disorder with hallucinations (HCC)   . Opioid use disorder, severe, dependence (HCC) 08/22/2014  . Alcohol use disorder, severe, dependence (HCC) 08/22/2014  . Alcohol withdrawal with perceptual disturbances (HCC) 08/22/2014  . Depression 08/19/2014  . Substance induced mood disorder (HCC)   . Opiate withdrawal (HCC) 11/09/2011  . Opiate addiction (HCC) 11/07/2011    Class: Acute  . Homeless 11/07/2011     Class: Chronic  . Alcohol dependence (HCC) 11/07/2011    Class: Acute    Past Surgical History:  Procedure Laterality Date  . L ankle surgery     [facility]  . Reconstructive surgery R great toe      Prior to Admission medications   Medication Sig Start Date End Date Taking? Authorizing Provider  acetaminophen (TYLENOL) 500 MG tablet Take 500 mg by mouth every 6 (six) hours as needed for mild pain or moderate pain.    [provider]  ciprofloxacin (CIPRO) 500 MG tablet Take 1 tablet (500 mg total) by mouth 2 (two) times daily. 08/26/17   Elson AreasSofia, Leslie K, PA-C  diclofenac (VOLTAREN) 50 MG EC tablet Take 1 tablet (50 mg total) by mouth 2 (two) times daily. 08/26/17   Elson AreasSofia, Leslie K, PA-C    Allergies  Allergen Reactions  . Morphine And Related Other (See Comments)    "Stomach Cramps"    Family History  Problem Relation Age of Onset  . Heart failure Mother   . Anesthesia problems Neg Hx   . Hypotension Neg Hx   . Malignant hyperthermia Neg Hx   . Pseudochol deficiency Neg Hx     Social History Social History   Tobacco Use  . Smoking status: Current Every Day Smoker    Packs/day: 0.50    Years: 15.00    Pack years: 7.50    Types: Cigarettes  . Smokeless tobacco: Current User  Types: Snuff  . Tobacco comment: declined cessation material  Substance Use Topics  . Alcohol use: Yes  . Drug use: Yes    Types: Morphine, Hydrocodone, Hydromorphone, Oxycodone, Other-see comments    Review of Systems  Constitutional: Negative for fever. Eyes: Negative for visual changes. ENT: Negative for sore throat. Cardiovascular: Positive for chest pressure, no pleuritic chest pain. Respiratory: Negative for shortness of breath or cough. Gastrointestinal: Negative for abdominal pain, vomiting and diarrhea. Genitourinary: Negative for dysuria. Musculoskeletal: Negative for back pain. Skin: Negative for rash. Neurological:  Negative for headache.  ____________________________________________   PHYSICAL EXAM:  VITAL SIGNS: ED Triage Vitals  Enc Vitals Group     BP 09/07/17 0645 (!) 151/88     Pulse Rate 09/07/17 0645 81     Resp 09/07/17 0645 20     Temp 09/07/17 0645 98.1 F (36.7 C)     Temp Source 09/07/17 0645 Oral     SpO2 09/07/17 0645 96 %     Weight 09/07/17 0655 230 lb (104.3 kg)     Height 09/07/17 0655 5\' 9"  (1.753 m)     Head Circumference --      [place] Flow --      Pain Score 09/07/17 0645 10     Pain Loc --      Pain Edu? --      Excl. in GC? --      Constitutional: Alert and oriented. Well appearing and in no distress.  Fairly anxious.  He is holding his chest with his hands.   HEENT   Head: Normocephalic and atraumatic.      Eyes: Conjunctivae are normal. Pupils equal and round.       Ears:         Nose: No congestion/rhinnorhea.   Mouth/Throat: Mucous membranes are moist.   Neck: No stridor. Cardiovascular/Chest: Normal rate, regular rhythm.  No murmurs, rubs, or gallops. Respiratory: Normal respiratory effort without tachypnea nor retractions. Breath sounds are clear and equal bilaterally. No wheezes/rales/rhonchi. Gastrointestinal: Soft. No distention, no guarding, no rebound. Nontender.    Genitourinary/rectal:Deferred Musculoskeletal: Nontender with normal range of motion in all extremities. No joint effusions.  No lower extremity tenderness.  No edema. Neurologic:  Normal speech and language. No gross or focal neurologic deficits are appreciated. Skin:  Skin is warm, dry and intact. No rash noted. Psychiatric: Anxious.  Otherwise, speech and behavior are normal. Patient exhibits appropriate insight and judgment.   ____________________________________________  LABS (pertinent positives/negatives) I, Governor Rooksebecca Kippy Gohman, MD the attending physician have reviewed the labs noted below.  Labs Reviewed  COMPREHENSIVE METABOLIC PANEL - Abnormal; Notable for the following  components:      Result Value   Calcium 8.8 (*)    All other components within normal limits  CBC WITH DIFFERENTIAL/PLATELET - Abnormal; Notable for the following components:   RDW 14.6 (*)    Platelets 143 (*)    All other components within normal limits  URINE DRUG SCREEN, QUALITATIVE ([facility] ONLY) - Abnormal; Notable for the following components:   Benzodiazepine, Ur Scrn POSITIVE (*)    All other components within normal limits  ETHANOL  TROPONIN I  TROPONIN I    ____________________________________________    EKG I, Governor Rooksebecca Donne Robillard, MD, the attending physician have personally viewed and interpreted all ECGs.  75 bpm.  Normal sinus rhythm.  Narrow QRS.  Normal axis.  Normal ST and T wave.  EKG #2 6:50 AM.  75 bpm.  Normal sinus rhythm.  Normal  axis.  Normal ST and T wave  EKG #3 7:32 AM.  79 bpm.  Normal sinus rhythm.  Narrow transfer normal axis.  Normal ST and T wave ____________________________________________  RADIOLOGY All Xrays were viewed by me.  Imaging interpreted by Radiologist, and I, Governor Rooks, MD the attending physician have reviewed the radiologist interpretation noted below.  cxr 2 view:  FINDINGS: The heart size and mediastinal contours are within normal limits. No pneumothorax or pleural effusion is noted. Left lung is clear. Mild right basilar subsegmental atelectasis or scarring is noted. The visualized skeletal structures are unremarkable.  IMPRESSION: Mild right basilar subsegmental atelectasis or scarring. __________________________________________  PROCEDURES  Procedure(s) performed: None  Critical Care performed: None   ____________________________________________  ED COURSE / ASSESSMENT AND PLAN  Pertinent labs & imaging results that were available during my care of the patient were reviewed by me and considered in my medical decision making (see chart for details).    Nonspecific chest discomfort, patient is extremely anxious  about cardiac etiology given family history.  No associated symptoms with this but is complaining of chest pressure.  3 EKGs repeated are overall reassuring.  Initial troponin is negative.  Will recheck in a few hours.  Patient seems extremely anxious.  I am going to try Ativan to see if that helps somewhat.  Patient continued to wince with central chest discomfort.  Patient requested valium that had worked previously - and was given tablet.  Repeat troponin reassuring/negative.    After recheck patient feels IV would work better, thinks dose of dilaudid would help.  At this point, I will give him a dose.  After dilaudid 0.5mg  patient feels better and ok for discharge.  He is going to return to [facility].  We discussed outpatient follow up for nonspecific chest discomfort.  DIFFERENTIAL DIAGNOSIS:Differential diagnosis includes, but is not limited to, ACS, aortic dissection, pulmonary embolism, cardiac tamponade, pneumothorax, pneumonia, pericarditis, myocarditis, GI-related causes including esophagitis/gastritis, and musculoskeletal chest wall pain.    CONSULTATIONS:   None   Patient / Family / Caregiver informed of clinical course, medical decision-making process, and agree with plan.   I discussed return precautions, follow-up instructions, and discharge instructions with patient and/or family.  Discharge Instructions : You are evaluated for chest pressure, and although no certain cause was found, your exam and evaluation are overall reassuring in the emergency department today.  Return to the emergency department immediately for any new or worsening chest pain, sweats, nausea, dizziness passing out, weakness, numbness, vision changes, or any other symptoms concerning to you.  As we discussed, you do need to follow with a cardiologist this week.    ___________________________________________   FINAL CLINICAL IMPRESSION(S) / ED DIAGNOSES   Final diagnoses:  Nonspecific chest pain       ___________________________________________        Note: This dictation was prepared with Dragon dictation. Any transcriptional errors that result from this process are unintentional    Governor Rooks, MD 09/07/17 1123

## 2017-09-07 NOTE — ED Notes (Signed)
Pt insisting on being discharge to wait outfront so he can smoke. Dr states he can leave now that enough time has elapsed from having dilaudid. Discharge given and pt will wait for his ride out front

## 2017-09-07 NOTE — Discharge Instructions (Signed)
You are evaluated for chest pressure, and although no certain cause was found, your exam and evaluation are overall reassuring in the emergency department today.  Return to the emergency department immediately for any new or worsening chest pain, sweats, nausea, dizziness passing out, weakness, numbness, vision changes, or any other symptoms concerning to you.  As we discussed, you do need to follow with a cardiologist this week.

## 2017-09-07 NOTE — ED Triage Notes (Addendum)
Patient presents to Emergency Department from [facility] via EMS with complaints of CP that started approximately 0200.  Pt describes CP as "elephant sitting on chest" center chest pain 10/10 without radiation, +nausea, and seeing different colored "sparkles"   Pt reports at [facility] for detox from ETOH, denies hx use of narcotics  Per EMS 12 lead unremarkable but 50 mcg fentanyl given via IV for pain approx 10 min prior to arrival

## 2017-09-08 ENCOUNTER — Emergency Department: Payer: Self-pay

## 2017-09-08 ENCOUNTER — Other Ambulatory Visit: Payer: Self-pay

## 2017-09-08 ENCOUNTER — Observation Stay
Admission: EM | Admit: 2017-09-08 | Discharge: 2017-09-09 | Disposition: A | Discharge status: Home / Self Care | Payer: Self-pay | Attending: Internal Medicine | Admitting: Internal Medicine

## 2017-09-08 ENCOUNTER — Encounter: Payer: Self-pay | Admitting: Emergency Medicine

## 2017-09-08 DIAGNOSIS — I252 Old myocardial infarction: Secondary | ICD-10-CM | POA: Insufficient documentation

## 2017-09-08 DIAGNOSIS — Z7982 Long term (current) use of aspirin: Secondary | ICD-10-CM | POA: Insufficient documentation

## 2017-09-08 DIAGNOSIS — R079 Chest pain, unspecified: Principal | ICD-10-CM | POA: Insufficient documentation

## 2017-09-08 DIAGNOSIS — F329 Major depressive disorder, single episode, unspecified: Secondary | ICD-10-CM | POA: Insufficient documentation

## 2017-09-08 DIAGNOSIS — Z79899 Other long term (current) drug therapy: Secondary | ICD-10-CM | POA: Insufficient documentation

## 2017-09-08 DIAGNOSIS — K7689 Other specified diseases of liver: Secondary | ICD-10-CM | POA: Insufficient documentation

## 2017-09-08 DIAGNOSIS — I509 Heart failure, unspecified: Secondary | ICD-10-CM | POA: Insufficient documentation

## 2017-09-08 DIAGNOSIS — F17210 Nicotine dependence, cigarettes, uncomplicated: Secondary | ICD-10-CM | POA: Insufficient documentation

## 2017-09-08 DIAGNOSIS — I110 Hypertensive heart disease with heart failure: Secondary | ICD-10-CM | POA: Insufficient documentation

## 2017-09-08 DIAGNOSIS — Z87442 Personal history of urinary calculi: Secondary | ICD-10-CM | POA: Insufficient documentation

## 2017-09-08 DIAGNOSIS — I700 Atherosclerosis of aorta: Secondary | ICD-10-CM | POA: Insufficient documentation

## 2017-09-08 DIAGNOSIS — F419 Anxiety disorder, unspecified: Secondary | ICD-10-CM | POA: Insufficient documentation

## 2017-09-08 HISTORY — DX: Heart failure, unspecified: I50.9

## 2017-09-08 LAB — CBC WITH DIFFERENTIAL/PLATELET
Basophils Absolute: 0 10*3/uL (ref 0–0.1)
Basophils Relative: 1 %
EOS ABS: 0.3 10*3/uL (ref 0–0.7)
EOS PCT: 5 %
HCT: 41 % (ref 40.0–52.0)
Hemoglobin: 13.4 g/dL (ref 13.0–18.0)
LYMPHS ABS: 1.2 10*3/uL (ref 1.0–3.6)
Lymphocytes Relative: 23 %
MCH: 29.3 pg (ref 26.0–34.0)
MCHC: 32.7 g/dL (ref 32.0–36.0)
MCV: 89.4 fL (ref 80.0–100.0)
MONOS PCT: 9 %
Monocytes Absolute: 0.5 10*3/uL (ref 0.2–1.0)
Neutro Abs: 3.5 10*3/uL (ref 1.4–6.5)
Neutrophils Relative %: 62 %
PLATELETS: 139 10*3/uL — AB (ref 150–440)
RBC: 4.59 MIL/uL (ref 4.40–5.90)
RDW: 14.9 % — ABNORMAL HIGH (ref 11.5–14.5)
WBC: 5.5 10*3/uL (ref 3.8–10.6)

## 2017-09-08 LAB — URINE DRUG SCREEN, QUALITATIVE (ARMC ONLY)
Amphetamines, Ur Screen: NOT DETECTED
BENZODIAZEPINE, UR SCRN: POSITIVE — AB
Barbiturates, Ur Screen: NOT DETECTED
Cannabinoid 50 Ng, Ur ~~LOC~~: NOT DETECTED
Cocaine Metabolite,Ur ~~LOC~~: NOT DETECTED
MDMA (Ecstasy)Ur Screen: NOT DETECTED
Methadone Scn, Ur: NOT DETECTED
OPIATE, UR SCREEN: NOT DETECTED
PHENCYCLIDINE (PCP) UR S: NOT DETECTED
Tricyclic, Ur Screen: NOT DETECTED

## 2017-09-08 LAB — FIBRIN DERIVATIVES D-DIMER (ARMC ONLY): Fibrin derivatives D-dimer (ARMC): 182.76 ng/mL (FEU) (ref 0.00–499.00)

## 2017-09-08 LAB — BASIC METABOLIC PANEL
Anion gap: 6 (ref 5–15)
BUN: 10 mg/dL (ref 6–20)
CALCIUM: 9 mg/dL (ref 8.9–10.3)
CO2: 26 mmol/L (ref 22–32)
CREATININE: 0.68 mg/dL (ref 0.61–1.24)
Chloride: 105 mmol/L (ref 101–111)
GFR calc Af Amer: >60 mL/min (ref >60)
Glucose, Bld: 135 mg/dL — ABNORMAL HIGH (ref 65–99)
Potassium: 4.6 mmol/L (ref 3.5–5.1)
SODIUM: 137 mmol/L (ref 135–145)

## 2017-09-08 LAB — TROPONIN I: Troponin I: <0.03 ng/mL (ref <0.03)

## 2017-09-08 MED ORDER — ENOXAPARIN SODIUM 40 MG/0.4ML ~~LOC~~ SOLN
40.0000 mg | SUBCUTANEOUS | Status: DC
  Administered 2017-09-08: 40 mg via SUBCUTANEOUS
  Filled 2017-09-08: qty 0.4

## 2017-09-08 MED ORDER — MORPHINE SULFATE (PF) 2 MG/ML IV SOLN
2.0000 mg | INTRAVENOUS | Status: DC | PRN
  Administered 2017-09-08 – 2017-09-09 (×3): 2 mg via INTRAVENOUS
  Filled 2017-09-08 (×3): qty 1

## 2017-09-08 MED ORDER — ONDANSETRON HCL 4 MG/2ML IJ SOLN: 4.0000 mg | Freq: Four times a day (QID) | INTRAMUSCULAR | Status: DC | PRN

## 2017-09-08 MED ORDER — ACETAMINOPHEN 500 MG PO TABS: 500.0000 mg | ORAL_TABLET | Freq: Four times a day (QID) | ORAL | Status: DC | PRN

## 2017-09-08 MED ORDER — TRAMADOL HCL 50 MG PO TABS
50.0000 mg | ORAL_TABLET | Freq: Four times a day (QID) | ORAL | Status: DC | PRN
  Administered 2017-09-08 – 2017-09-09 (×2): 50 mg via ORAL
  Filled 2017-09-08 (×2): qty 1

## 2017-09-08 MED ORDER — ADULT MULTIVITAMIN W/MINERALS CH
1.0000 | ORAL_TABLET | Freq: Every day | ORAL | Status: DC
  Administered 2017-09-09: 1 via ORAL
  Filled 2017-09-08: qty 1

## 2017-09-08 MED ORDER — SODIUM CHLORIDE 0.9 % IV BOLUS (SEPSIS)
1000.0000 mL | Freq: Once | INTRAVENOUS | Status: AC
  Administered 2017-09-08: 1000 mL via INTRAVENOUS

## 2017-09-08 MED ORDER — FENTANYL CITRATE (PF) 100 MCG/2ML IJ SOLN
100.0000 ug | Freq: Once | INTRAMUSCULAR | Status: AC
  Administered 2017-09-08: 100 ug via INTRAVENOUS
  Filled 2017-09-08: qty 2

## 2017-09-08 MED ORDER — IOPAMIDOL (ISOVUE-370) INJECTION 76%
100.0000 mL | Freq: Once | INTRAVENOUS | Status: AC | PRN
  Administered 2017-09-08: 100 mL via INTRAVENOUS

## 2017-09-08 MED ORDER — FENTANYL CITRATE (PF) 100 MCG/2ML IJ SOLN
50.0000 ug | Freq: Once | INTRAMUSCULAR | Status: AC
  Administered 2017-09-08: 50 ug via INTRAVENOUS
  Filled 2017-09-08: qty 2

## 2017-09-08 MED ORDER — ASPIRIN EC 81 MG PO TBEC: 81.0000 mg | DELAYED_RELEASE_TABLET | Freq: Every day | ORAL | Status: DC

## 2017-09-08 MED ORDER — CLONIDINE HCL 0.1 MG PO TABS: 0.1000 mg | ORAL_TABLET | ORAL | Status: DC | PRN

## 2017-09-08 MED ORDER — VITAMIN B-1 100 MG PO TABS
100.0000 mg | ORAL_TABLET | Freq: Every day | ORAL | Status: DC
  Administered 2017-09-09: 100 mg via ORAL
  Filled 2017-09-08: qty 1

## 2017-09-08 MED ORDER — AMANTADINE HCL 100 MG PO CAPS
100.0000 mg | ORAL_CAPSULE | Freq: Two times a day (BID) | ORAL | Status: DC | PRN
  Filled 2017-09-08: qty 1

## 2017-09-08 MED ORDER — HYDROXYZINE HCL 25 MG PO TABS: 25.0000 mg | ORAL_TABLET | Freq: Three times a day (TID) | ORAL | Status: DC | PRN

## 2017-09-08 MED ORDER — FOLIC ACID 1 MG PO TABS
1.0000 mg | ORAL_TABLET | Freq: Every day | ORAL | Status: DC
  Administered 2017-09-09: 1 mg via ORAL
  Filled 2017-09-08: qty 1

## 2017-09-08 MED ORDER — GI COCKTAIL ~~LOC~~
30.0000 mL | Freq: Three times a day (TID) | ORAL | Status: DC | PRN
  Filled 2017-09-08: qty 30

## 2017-09-08 MED ORDER — KETOROLAC TROMETHAMINE 30 MG/ML IJ SOLN
30.0000 mg | Freq: Once | INTRAMUSCULAR | Status: AC
  Administered 2017-09-08: 30 mg via INTRAVENOUS
  Filled 2017-09-08: qty 1

## 2017-09-08 MED ORDER — ASPIRIN EC 81 MG PO TBEC
81.0000 mg | DELAYED_RELEASE_TABLET | Freq: Every day | ORAL | Status: DC
  Administered 2017-09-09: 81 mg via ORAL
  Filled 2017-09-08: qty 1

## 2017-09-08 MED ORDER — PHENYTOIN SODIUM EXTENDED 100 MG PO CAPS
200.0000 mg | ORAL_CAPSULE | Freq: Two times a day (BID) | ORAL | Status: DC
  Administered 2017-09-08 – 2017-09-09 (×2): 200 mg via ORAL
  Filled 2017-09-08 (×2): qty 2

## 2017-09-08 NOTE — H&P (Signed)
[facility] [place] at [facility]   PATIENT NAME: Darren Mendoza    MR#:  [id number]  DATE OF BIRTH:  Apr 06, 1956  DATE OF ADMISSION:  09/08/2017  PRIMARY CARE PHYSICIAN: Patient, No Pcp Per   REQUESTING/REFERRING PHYSICIAN:   CHIEF COMPLAINT:   Chest pain recurrent episodes HISTORY OF PRESENT ILLNESS:  Darren Mendoza  is a 61 y.o. male with a known history of MI as reported by the patient, opiate abuse, anxiety is presenting to the ED with a chief complaint of chest pain. Patient came to the ED with a similar complaint of chest pain ruled out acute MI and he was discharged home. Patient was having recurrent episodes of chest pain in the left side of the chest pain was 10 out of 10 with no radiation, associated with shortness of breath which prompted him to come to the ED again. Patient was given fentanyl, aspirin and nitroglycerin with no relief of pain according to the ED physician. During my examination patient is resting comfortably, denies any chest pain. He reports that he is not truly allergic to morphine gives him some stomach discomfort  PAST MEDICAL HISTORY:   Past Medical History:  Diagnosis Date  . Alcohol abuse   . Anxiety   . CHF (congestive heart failure) (HCC)   . Depression   . Kidney stones   . Opiate abuse, episodic (HCC)   . Opiate withdrawal (HCC) 11/09/2011  . Renal disorder     PAST SURGICAL HISTOIRY:   Past Surgical History:  Procedure Laterality Date  . L ankle surgery     [facility]  . Reconstructive surgery R great toe      SOCIAL HISTORY:   Social History   Tobacco Use  . Smoking status: Current Every Day Smoker    Packs/day: 0.50    Years: 15.00    Pack years: 7.50    Types: Cigarettes  . Smokeless tobacco: Current User    Types: Snuff  . Tobacco comment: declined cessation material  Substance Use Topics  . Alcohol use: Yes    FAMILY HISTORY:   Family History   Problem Relation Age of Onset  . Heart failure Mother   . Anesthesia problems Neg Hx   . Hypotension Neg Hx   . Malignant hyperthermia Neg Hx   . Pseudochol deficiency Neg Hx     DRUG ALLERGIES:   Allergies  Allergen Reactions  . Morphine And Related Other (See Comments)    "Stomach Cramps"    REVIEW OF SYSTEMS:  CONSTITUTIONAL: No fever, fatigue or weakness.  EYES: No blurred or double vision.  EARS, NOSE, AND THROAT: No tinnitus or ear pain.  RESPIRATORY: No cough, shortness of breath, wheezing or hemoptysis.  CARDIOVASCULAR: No chest pain, orthopnea, edema.  GASTROINTESTINAL: No nausea, vomiting, diarrhea or abdominal pain.  GENITOURINARY: No dysuria, hematuria.  ENDOCRINE: No polyuria, nocturia,  HEMATOLOGY: No anemia, easy bruising or bleeding SKIN: No rash or lesion. MUSCULOSKELETAL: No joint pain or arthritis.   NEUROLOGIC: No tingling, numbness, weakness.  PSYCHIATRY: No anxiety or depression.   MEDICATIONS AT HOME:   Prior to Admission medications   Medication Sig Start Date End Date Taking? Authorizing Provider  amantadine (SYMMETREL) 100 MG capsule Take 100 mg by mouth every 12 (twelve) hours as needed (for craving).   Yes [provider]  cloNIDine (CATAPRES) 0.1 MG tablet Take 0.1 mg by mouth every 4 (four) hours as needed (as needed  for opiate withdrawl).   Yes [provider]  folic acid (FOLVITE) 1 MG tablet Take 1 mg by mouth daily.   Yes [provider]  hydrOXYzine (ATARAX/VISTARIL) 25 MG tablet Take 25-50 mg by mouth every 6 (six) hours as needed.   Yes [provider]  Multiple Vitamin (MULTI-DAY) TABS Take 1 tablet by mouth daily.   Yes [provider]  phenytoin (DILANTIN) 100 MG ER capsule Take 200 mg by mouth 2 (two) times daily.   Yes [provider]  thiamine 100 MG tablet Take 100 mg by mouth daily.   Yes [provider]  acetaminophen (TYLENOL) 500 MG tablet Take 500 mg by mouth  every 6 (six) hours as needed for mild pain or moderate pain.    [provider]  ciprofloxacin (CIPRO) 500 MG tablet Take 1 tablet (500 mg total) by mouth 2 (two) times daily. Patient not taking: Reported on 09/08/2017 08/26/17   Elson Areas, PA-C  diclofenac (VOLTAREN) 50 MG EC tablet Take 1 tablet (50 mg total) by mouth 2 (two) times daily. Patient not taking: Reported on 09/08/2017 08/26/17   Elson Areas, PA-C      VITAL SIGNS:  Blood pressure (!) 152/88, pulse 68, temperature 97.8 F (36.6 C), temperature source Oral, resp. rate 16, weight 104.3 kg (230 lb), SpO2 97 %.  PHYSICAL EXAMINATION:  GENERAL:  61 y.o.-year-old patient lying in the bed with no acute distress.  EYES: Pupils equal, round, reactive to light and accommodation. No scleral icterus. Extraocular muscles intact.  HEENT: Head atraumatic, normocephalic. Oropharynx and nasopharynx clear.  NECK:  Supple, no jugular venous distention. No thyroid enlargement, no tenderness.  LUNGS: Normal breath sounds bilaterally, no wheezing, rales,rhonchi or crepitation. No use of accessory muscles of respiration.  CARDIOVASCULAR: S1, S2 normal. No murmurs, rubs, or gallops.  ABDOMEN: Soft, nontender, nondistended. Bowel sounds present. No organomegaly or mass.  EXTREMITIES: No pedal edema, cyanosis, or clubbing.  NEUROLOGIC: Cranial nerves II through XII are intact. Muscle strength 5/5 in all extremities. Sensation intact. Gait not checked.  PSYCHIATRIC: The patient is alert and oriented x 3.  SKIN: No obvious rash, lesion, or ulcer.   LABORATORY PANEL:   CBC Recent Labs  Lab 09/08/17 0948  WBC 5.5  HGB 13.4  HCT 41.0  PLT 139*   ------------------------------------------------------------------------------------------------------------------  Chemistries  Recent Labs  Lab 09/07/17 0653 09/08/17 0948  NA 140 137  K 4.6 4.6  CL 107 105  CO2 26 26  GLUCOSE 96 135*  BUN 9 10  CREATININE 0.64 0.68   CALCIUM 8.8* 9.0  AST 33  --   ALT 30  --   ALKPHOS 83  --   BILITOT 0.5  --    ------------------------------------------------------------------------------------------------------------------  Cardiac Enzymes Recent Labs  Lab 09/08/17 1547  TROPONINI <0.03   ------------------------------------------------------------------------------------------------------------------  RADIOLOGY:  Dg Chest Port 1 View  Result Date: 09/07/2017 CLINICAL DATA:  Chest pain. EXAM: PORTABLE CHEST 1 VIEW COMPARISON:  Radiographs of December 17, 2016. FINDINGS: The heart size and mediastinal contours are within normal limits. No pneumothorax or pleural effusion is noted. Left lung is clear. Mild right basilar subsegmental atelectasis or scarring is noted. The visualized skeletal structures are unremarkable. IMPRESSION: Mild right basilar subsegmental atelectasis or scarring. Electronically Signed   By: Lupita Raider, M.D.   On: 09/07/2017 07:21   Ct Angio Chest Aorta W And/or Wo Contrast  Result Date: 09/08/2017 CLINICAL DATA:  Chest pain. EXAM: CT ANGIOGRAPHY CHEST  WITH CONTRAST TECHNIQUE: Multidetector CT imaging of the chest was performed using the standard protocol during bolus administration of intravenous contrast. Multiplanar CT image reconstructions and MIPs were obtained to evaluate the vascular anatomy. CONTRAST:  100mL ISOVUE-370 IOPAMIDOL (ISOVUE-370) INJECTION 76% COMPARISON:  Chest x-ray dated 09/07/2017 and CT scan of the abdomen dated 08/25/2017 FINDINGS: Cardiovascular: Satisfactory opacification of the pulmonary arteries to the segmental level. No evidence of pulmonary embolism. Normal heart size. No pericardial effusion. No aortic dissection. Slight aortic atherosclerosis. Mediastinum/Nodes: No enlarged mediastinal, hilar, or axillary lymph nodes. Thyroid gland, trachea, and esophagus demonstrate no significant findings. Lungs/Pleura: Lungs are clear. No pleural effusion or pneumothorax.  Upper Abdomen: Benign-appearing 5.7 cm cyst in the posterior aspect of the dome of the right lobe of the liver, unchanged. Musculoskeletal: No acute abnormality. Degenerative disc disease in the lower thoracic spine. Review of the MIP images confirms the above findings. IMPRESSION: 1. No evidence of pulmonary embolism or aortic dissection or aneurysm. 2. Clear lungs. 3. No acute abnormalities. 4. Mild aortic atherosclerosis. Electronically Signed   By: Francene BoyersJames  Maxwell M.D.   On: 09/08/2017 12:39    EKG:   Orders placed or performed during the hospital encounter of 09/08/17  . EKG 12-Lead  . EKG 12-Lead    IMPRESSION AND PLAN:   Darren Mendoza  is a 61 y.o. male with a known history of MI as reported by the patient, opiate abuse, anxiety is presenting to the ED with a chief complaint of chest pain. Patient came to the ED with a similar complaint of chest pain ruled out acute MI and he was discharged home. Patient was having recurrent episodes of chest pain in the left side of the chest pain was 10 out of 10 with no radiation, associated with shortness of breath  # Recurrent chest pain with history of opiate abuse with history of MI in the past Admit to telemetry Initial troponin is negative Cycle cardiac biomarkers Check urine drug screen Consulted cardiology EKG normal sinus rhythm; Echocardiogram  #Essential hypertension Continue home medications clonidine  #Anxiety Atarax as needed  #History of opiate abuse Check urine drug screen   #Tobacco abuse disorder Counseled patient to quit smoking for 5 minutes. We will start him on nicotine patch after ruling out acute MI, probably from tomorrow  GI prophylaxis with Protonix and due to prophylaxis with Lovenox subcutaneous   All the records are reviewed and case discussed with ED provider. Management plans discussed with the patient, family and they are in agreement.  CODE STATUS: FC, brother cleve dodd is HCPOA  TOTAL TIME TAKING  CARE OF THIS PATIENT:  43  minutes.   Note: This dictation was prepared with Dragon dictation along with smaller phrase technology. Any transcriptional errors that result from this process are unintentional.  Ramonita LabGouru, Dann Galicia M.D on 09/08/2017 at 6:01 PM  Between 7am to 6pm - Pager - [phone]  After 6pm go to [email or web address] - password [email or web address]  [facility]  Office  [phone]  CC: Primary care physician; Patient, No Pcp Per

## 2017-09-08 NOTE — Progress Notes (Signed)
Talked to Dr. Amado CoeGouru about patient's requesting to have oral pain medication other than the tylenol as tylenol does not help him, tramadol PRN given. Also clarify about patient's allergies listed for Morphine and per MD, she did clarify this with the patient and ok to give medication. No other concern at the moment. RN will continue to monitor.

## 2017-09-08 NOTE — ED Triage Notes (Signed)
Pt arrived via EMS from clinic where he was being seen for physical exam. Pt seen here yesterday for chest pain, pt reports CP unrelieved. EMS reports VSS, NSR. EMS administered ASA 324 mg, 1 spray NTG and 15 mcg fentanyl.

## 2017-09-08 NOTE — ED Notes (Signed)
Pt called out, upon assessment, pt holding center of his chest, states, "My pain is back, its feels like someone is stabbing me."  Vitals WDL.  EDP made aware.

## 2017-09-08 NOTE — ED Provider Notes (Signed)
[facility] Emergency Department Provider Note  ____________________________________________   First MD Initiated Contact with Patient 09/08/17 [phone]     (approximate)  I have reviewed the triage vital signs and the nursing notes.   HISTORY  Chief Complaint Chest Pain   HPI Darren Mendoza is a 61 y.o. male with a history of kidney stones, opiate abuse as well as alcohol abuse and anxiety who is presenting to the emergency department chest pain.  He was here just yesterday with similar chest pain.  He says that his pain is 10 out of 10 and to the left side of his chest.  He denies any radiation.  Says that it is associated with shortness of breath.  Denies any nausea, vomiting.  Patient without any known cardiac disease.  Was given fentanyl as well as nitroglycerin and aspirin prior to arrival without relief of his pain.  Past Medical History:  Diagnosis Date  . Alcohol abuse   . Anxiety   . CHF (congestive heart failure) (HCC)   . Depression   . Kidney stones   . Opiate abuse, episodic (HCC)   . Opiate withdrawal (HCC) 11/09/2011  . Renal disorder     Patient Active Problem List   Diagnosis Date Noted  . Opioid withdrawal (HCC)   . Alcohol withdrawal syndrome with perceptual disturbance (HCC)   . 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 Substance-induced psychotic disorder with hallucinations (HCC)   . Opioid use disorder, severe, dependence (HCC) 08/22/2014  . Alcohol use disorder, severe, dependence (HCC) 08/22/2014  . Alcohol withdrawal with perceptual disturbances (HCC) 08/22/2014  . Depression 08/19/2014  . Substance induced mood disorder (HCC)   . Opiate withdrawal (HCC) 11/09/2011  . Opiate addiction (HCC) 11/07/2011    Class: Acute  . Homeless 11/07/2011    Class: Chronic  . Alcohol dependence (HCC) 11/07/2011    Class: Acute    Past Surgical History:  Procedure Laterality Date  . L  ankle surgery     Plates and screws placed last October at [facility]  . Reconstructive surgery R great toe      Prior to Admission medications   Medication Sig Start Date End Date Taking? Authorizing Provider  amantadine (SYMMETREL) 100 MG capsule Take 100 mg by mouth every 12 (twelve) hours as needed (for craving).   Yes [provider]  cloNIDine (CATAPRES) 0.1 MG tablet Take 0.1 mg by mouth every 4 (four) hours as needed (as needed for opiate withdrawl).   Yes [provider]  folic acid (FOLVITE) 1 MG tablet Take 1 mg by mouth daily.   Yes [provider]  hydrOXYzine (ATARAX/VISTARIL) 25 MG tablet Take 25-50 mg by mouth every 6 (six) hours as needed.   Yes [provider]  Multiple Vitamin (MULTI-DAY) TABS Take 1 tablet by mouth daily.   Yes [provider]  phenytoin (DILANTIN) 100 MG ER capsule Take 200 mg by mouth 2 (two) times daily.   Yes [provider]  thiamine 100 MG tablet Take 100 mg by mouth daily.   Yes [provider]  acetaminophen (TYLENOL) 500 MG tablet Take 500 mg by mouth every 6 (six) hours as needed for mild pain or moderate pain.    [provider]  ciprofloxacin (CIPRO) 500 MG tablet Take 1 tablet (500 mg total) by mouth 2 (two) times daily. Patient not taking: Reported on 09/08/2017 08/26/17   Elson Areas, PA-C  diclofenac (VOLTAREN) 50 MG EC tablet Take 1 tablet (50 mg total) by mouth 2 (two) times daily. Patient not taking: Reported on 09/08/2017 08/26/17   Elson AreasSofia, Leslie K, PA-C    Allergies Morphine and related  Family History  Problem Relation Age of Onset  . Heart failure Mother   . Anesthesia problems Neg Hx   . Hypotension Neg Hx   . Malignant hyperthermia Neg Hx   . Pseudochol deficiency Neg Hx     Social History Social History   Tobacco Use  . Smoking status: Current Every Day Smoker    Packs/day: 0.50    Years: 15.00    Pack years: 7.50    Types: Cigarettes  .  Smokeless tobacco: Current User    Types: Snuff  . Tobacco comment: declined cessation material  Substance Use Topics  . Alcohol use: Yes  . Drug use: Yes    Types: Morphine, Hydrocodone, Hydromorphone, Oxycodone, Other-see comments    Review of Systems  Constitutional: No fever/chills Eyes: No visual changes. ENT: No sore throat. Cardiovascular: Above Respiratory: As above Gastrointestinal: No abdominal pain.  No nausea, no vomiting.  No diarrhea.  No constipation. Genitourinary: Negative for dysuria. Musculoskeletal: Negative for back pain. Skin: Negative for rash. Neurological: Negative for headaches, focal weakness or numbness.   ____________________________________________   PHYSICAL EXAM:  VITAL SIGNS: ED Triage Vitals  Enc Vitals Group     BP 09/08/17 0949 135/80     Pulse Rate 09/08/17 0949 92     Resp 09/08/17 0949 18     Temp 09/08/17 0949 97.8 F (36.6 C)     Temp Source 09/08/17 0949 Oral     SpO2 09/08/17 0949 97 %     Weight 09/08/17 0950 230 lb (104.3 kg)     Height --      Head Circumference --      Peak Flow --      Pain Score 09/08/17 0949 10     Pain Loc --      Pain Edu? --      Excl. in GC? --     Constitutional: Alert and oriented.  Patient appears uncomfortable.  Rolling on the stretcher. Eyes: Conjunctivae are normal.  Head: Atraumatic. Nose: No congestion/rhinnorhea. Mouth/Throat: Mucous membranes are moist.  Neck: No stridor.   Cardiovascular: Normal rate, regular rhythm. Grossly normal heart sounds.   Respiratory: Normal respiratory effort.  No retractions. Lungs CTAB. Gastrointestinal: Soft and nontender. No distention.  Musculoskeletal: No lower extremity tenderness nor edema.  No joint effusions. Neurologic:  Normal speech and language. No gross focal neurologic deficits are appreciated. Skin:  Skin is warm, dry and intact. No rash noted. Psychiatric: Mood and affect are normal. Speech and behavior are  normal.  ____________________________________________   LABS (all labs ordered are listed, but only abnormal results are displayed)  Labs Reviewed  CBC WITH DIFFERENTIAL/PLATELET - Abnormal; Notable for the following components:      Result Value   RDW 14.9 (*)    Platelets 139 (*)    All other components within normal limits  BASIC METABOLIC PANEL - Abnormal; Notable for the following components:   Glucose, Bld 135 (*)    All other components within normal limits  TROPONIN I  FIBRIN DERIVATIVES D-DIMER ([facility] ONLY)  TROPONIN I   ____________________________________________  EKG  ED ECG REPORT I, Arelia Longestavid M Key Cen, the attending physician, personally viewed and interpreted this ECG.   Date: 09/08/2017  EKG Time: 0946  Rate: 93  Rhythm: normal sinus rhythm  Axis: Normal  Intervals:none  ST&T Change: No ST segment elevation or depression.  No abnormal T wave inversion.  ____________________________________________  RADIOLOGY   ____________________________________________   PROCEDURES  Procedure(s) performed:   Procedures  Critical Care performed:   ____________________________________________   INITIAL IMPRESSION / ASSESSMENT AND PLAN / ED COURSE  Pertinent labs & imaging results that were available during my care of the patient were reviewed by me and considered in my medical decision making (see chart for details).  Differential diagnosis includes, but is not limited to, ACS, aortic dissection, pulmonary embolism, cardiac tamponade, pneumothorax, pneumonia, pericarditis, myocarditis, GI-related causes including esophagitis/gastritis, and musculoskeletal chest wall pain.   As part of my medical decision making, I reviewed the following data within the electronic MEDICAL RECORD NUMBER Notes from prior ED visits   ----------------------------------------- 1:07 PM on 09/08/2017 -----------------------------------------  Patient repeatedly complaining of chest  pain.  Has received several doses of fentanyl without relief.  Prior to arrival was given nitroglycerin without any relief.  Patient now with his second day of chest pain without any concerning findings on his lab work or imaging.  I am concerned that the patient may be seeking opiates as he has a history of addiction.  However, he also says that he has a family history of heart disease and has had an MI in the past.  Because of his intractable pain he will be admitted for observation.  Signed out to Dr. Amado CoeGouru.  The patient does not have any stress test or catheterizations on the record for reference.     ____________________________________________   FINAL CLINICAL IMPRESSION(S) / ED DIAGNOSES  Chest pain.    NEW MEDICATIONS STARTED DURING THIS VISIT:  This SmartLink is deprecated. Use AVSMEDLIST instead to display the medication list for a patient.   Note:  This document was prepared using Dragon voice recognition software and may include unintentional dictation errors.     Myrna BlazerSchaevitz, Alvia Jablonski Matthew, MD 09/08/17 [phone]

## 2017-09-08 NOTE — ED Notes (Signed)
Patient transported to CT 

## 2017-09-09 ENCOUNTER — Observation Stay: Admit: 2017-09-09 | Payer: Self-pay

## 2017-09-09 LAB — LIPID PANEL
CHOL/HDL RATIO: 2.1 ratio
Cholesterol: 126 mg/dL (ref 0–200)
HDL: 61 mg/dL (ref >40)
LDL CALC: 55 mg/dL (ref 0–99)
Triglycerides: 49 mg/dL (ref <150)
VLDL: 10 mg/dL (ref 0–40)

## 2017-09-09 MED ORDER — IBUPROFEN 200 MG PO TABS: 200.0000 mg | ORAL_TABLET | Freq: Three times a day (TID) | ORAL | 0 refills | Status: AC | PRN

## 2017-09-09 MED ORDER — KETOROLAC TROMETHAMINE 30 MG/ML IJ SOLN
30.0000 mg | Freq: Four times a day (QID) | INTRAMUSCULAR | Status: DC | PRN
  Filled 2017-09-09: qty 1

## 2017-09-09 MED ORDER — HYDROMORPHONE HCL 1 MG/ML IJ SOLN
1.0000 mg | Freq: Once | INTRAMUSCULAR | Status: AC
  Administered 2017-09-09: 1 mg via INTRAVENOUS
  Filled 2017-09-09: qty 1

## 2017-09-09 MED ORDER — MORPHINE SULFATE (PF) 2 MG/ML IV SOLN
2.0000 mg | Freq: Once | INTRAVENOUS | Status: AC
  Administered 2017-09-09: 2 mg via INTRAVENOUS
  Filled 2017-09-09: qty 1

## 2017-09-09 MED ORDER — ASPIRIN 81 MG PO TBEC: 81.0000 mg | DELAYED_RELEASE_TABLET | Freq: Every day | ORAL | Status: AC

## 2017-09-09 NOTE — Consult Note (Signed)
[facility]  CARDIOLOGY CONSULT NOTE  Patient ID: Darren Mendoza MRN: [id number] DOB/AGE: Feb 22, 1956 61 y.o.  Admit date: 09/08/2017 Referring Physician Dr. Elpidio AnisSudini Primary Physician   Primary Cardiologist   Reason for Consultation chest pain  HPI: Patient is a 61 year old male with history of atypical chest pain.  He also has a history of hypertension, history of substance abuse.  He smokes cigarettes.  He has a family history of heart disease.  He denies diabetes.  He is treated for hypertension.  He was admitted with midsternal chest pain which was episodic.  His electrocardiogram was normal.  He has ruled out for myocardial infarction.  Chest CT revealed no pulmonary emboli or dissection.  He has intermittent episodes of pain relieved with morphine.  He is anxious to go home.  He had a functional study in 2011 in [place] which was normal.  He denies any prior or subsequent invasive cardiac evaluation.  He is able to ambulate without any significant change in his status in the hospital.  He is hemodynamically stable.  Review of Systems  Constitutional: Negative.   HENT: Negative.   Eyes: Negative.   Respiratory: Negative.   Cardiovascular: Positive for chest pain.  Gastrointestinal: Negative.   Genitourinary: Negative.   Musculoskeletal: Negative.   Skin: Negative.   Neurological: Negative.   Endo/Heme/Allergies: Negative.   Psychiatric/Behavioral: Negative.     Past Medical History:  Diagnosis Date  . Alcohol abuse   . Anxiety   . CHF (congestive heart failure) (HCC)   . Depression   . Kidney stones   . Opiate abuse, episodic (HCC)   . Opiate withdrawal (HCC) 11/09/2011  . Renal disorder     Family History  Problem Relation Age of Onset  . Heart failure Mother   . Anesthesia problems Neg Hx   . Hypotension Neg Hx   . Malignant hyperthermia Neg Hx   . Pseudochol deficiency Neg Hx     Social History   Socioeconomic  History  . Marital status: Single    Spouse name: Not on file  . Number of children: Not on file  . Years of education: Not on file  . Highest education level: Not on file  Social Needs  . Financial resource strain: Not on file  . Food insecurity - worry: Not on file  . Food insecurity - inability: Not on file  . Transportation needs - medical: Not on file  . Transportation needs - non-medical: Not on file  Occupational History  . Not on file  Tobacco Use  . Smoking status: Current Every Day Smoker    Packs/day: 0.50    Years: 15.00    Pack years: 7.50    Types: Cigarettes  . Smokeless tobacco: Current User    Types: Snuff  . Tobacco comment: declined cessation material  Substance and Sexual Activity  . Alcohol use: Yes  . Drug use: Yes    Types: Morphine, Hydrocodone, Hydromorphone, Oxycodone, Other-see comments  . Sexual activity: Not on file  Other Topics Concern  . Not on file  Social History Narrative  . Not on file    Past Surgical History:  Procedure Laterality Date  . L ankle surgery     [facility]  . Reconstructive surgery R great toe       Medications Prior to Admission  Medication Sig Dispense Refill Last Dose  . amantadine (SYMMETREL) 100 MG  capsule Take 100 mg by mouth every 12 (twelve) hours as needed (for craving).   09/07/2017 at 1000  . cloNIDine (CATAPRES) 0.1 MG tablet Take 0.1 mg by mouth every 4 (four) hours as needed (as needed for opiate withdrawl).   09/08/2017 at 0800  . folic acid (FOLVITE) 1 MG tablet Take 1 mg by mouth daily.   09/08/2017 at 0700  . hydrOXYzine (ATARAX/VISTARIL) 25 MG tablet Take 25-50 mg by mouth every 6 (six) hours as needed.   09/08/2017 at 0800  . Multiple Vitamin (MULTI-DAY) TABS Take 1 tablet by mouth daily.   09/08/2017 at 0700  . phenytoin (DILANTIN) 100 MG ER capsule Take 200 mg by mouth 2 (two) times daily.   09/08/2017 at 0700  . thiamine 100 MG tablet Take 100 mg by mouth  daily.   09/08/2017 at 0700  . acetaminophen (TYLENOL) 500 MG tablet Take 500 mg by mouth every 6 (six) hours as needed for mild pain or moderate pain.   prn at prn  . ciprofloxacin (CIPRO) 500 MG tablet Take 1 tablet (500 mg total) by mouth 2 (two) times daily. (Patient not taking: Reported on 09/08/2017) 20 tablet 0 Not Taking at Unknown time  . diclofenac (VOLTAREN) 50 MG EC tablet Take 1 tablet (50 mg total) by mouth 2 (two) times daily. (Patient not taking: Reported on 09/08/2017) 20 tablet 0 Not Taking at Unknown time    Physical Exam: Blood pressure (!) 132/94, pulse 73, temperature 97.7 F (36.5 C), temperature source Oral, resp. rate 18, weight 104.3 kg (230 lb), SpO2 100 %.   Wt Readings from Last 1 Encounters:  09/08/17 104.3 kg (230 lb)     General appearance: alert and cooperative Resp: clear to auscultation bilaterally Chest wall: no tenderness Cardio: regular rate and rhythm GI: soft, non-tender; bowel sounds normal; no masses,  no organomegaly Extremities: extremities normal, atraumatic, no cyanosis or edema Pulses: 2+ and symmetric Neurologic: Grossly normal  Labs:   Lab Results  Component Value Date   WBC 5.5 09/08/2017   HGB 13.4 09/08/2017   HCT 41.0 09/08/2017   MCV 89.4 09/08/2017   PLT 139 (L) 09/08/2017    Recent Labs  Lab 09/07/17 0653 09/08/17 0948  NA 140 137  K 4.6 4.6  CL 107 105  CO2 26 26  BUN 9 10  CREATININE 0.64 0.68  CALCIUM 8.8* 9.0  PROT 6.6  --   BILITOT 0.5  --   ALKPHOS 83  --   ALT 30  --   AST 33  --   GLUCOSE 96 135*   Lab Results  Component Value Date   TROPONINI <0.03 09/08/2017      Radiology: No acute cardiopulmonary disease.  No aortic dissection or aortic aneurysm.  No pulmonary embolism EKG: Normal sinus rhythm with no ischemia  ASSESSMENT AND PLAN: 61 year old male with history of substance abuse, history of seizure disorder on Dilantin, history of hypertension treated with clonidine, history of tobacco  abuse who was admitted with chest pain.  He has ruled out for myocardial infarction.  Electrocardiogram is normal.  Patient's 10-year cardiovascular risk is 8.9%.  If he discontinued smoking, his risk reduces to 5.4%.  Patient is able to ambulate without chest pain.  We discussed risk factor modification.  His serum cholesterol is good.  Smoke cessation is discussed.  Would recommend ambulation and discharge on current regimen with outpatient follow-up for consideration for an ETT. Signed: Dalia HeadingKenneth A Princeston Blizzard MD, [facility] 09/09/2017, 9:50 AM

## 2017-09-09 NOTE — Progress Notes (Signed)
Pt was having some chest pain. Notice that hes allergic to morphine. Doctor was notified and discontinued the morphine and ordered toradol. I informed the pt about it and states that toradol is not helping for chest pain. Notifed doctor sudini again and states that pt has no allergy to morhpine and been taking morphine all night. Morphine was given. Will continue to monitor.

## 2017-09-09 NOTE — Progress Notes (Signed)
Pt d/c to home today.  IV removed intact.  D/c paperwork printed and reviewed w/pt.  All medication questions and concerns reviewed and pt states understanding.  All Rx's given to patient. Pt requested to walk to elevator for d/c.

## 2017-09-09 NOTE — Progress Notes (Signed)
Patient complaining of 10/10 chest pain that is unrealized by Morphine, and Tramadol. Patient states that they only thing that has brought him relief was dilaudid that was given in the ED. MD Sherryll BurgerShah notified. Per MD give one time dose of dilaudid 1 mg. Will continue to monitor.   Update: Patient still complaining of 10/10 chest pain. MD Pyreddy notified. Verbal order given for 1 mg dilaudid once. Will continue to monitor.

## 2017-09-09 NOTE — Discharge Instructions (Signed)
Resume diet and activity as before ° ° °

## 2017-09-09 NOTE — Plan of Care (Signed)
  Progressing Education: Knowledge of General Education information will improve 09/09/2017 0540 - Progressing by Dorna LeitzNesbitt, Yaffa Seckman M, RN   Not Progressing Cardiac: Ability to achieve and maintain adequate cardiovascular perfusion will improve 09/09/2017 0540 - Not Progressing by Dorna LeitzNesbitt, Lucette Kratz M, RN Note Patient has required frequent pain medication for chest pain that only resolves for a short period of time with dilaudid.

## 2017-09-10 ENCOUNTER — Telehealth: Payer: Self-pay

## 2017-09-10 LAB — HIV ANTIBODY (ROUTINE TESTING W REFLEX): HIV SCREEN 4TH GENERATION: NONREACTIVE

## 2017-09-10 NOTE — Telephone Encounter (Signed)
Lmov for patient to call and schedule appointment  They were seen in ED on 09/07/17 for CP   Will try again at a later time

## 2017-09-12 NOTE — Telephone Encounter (Signed)
Lmov for patient to call and schedule appointment  °They were seen in ED on 09/07/17 for CP  ° °Will try again at a later time ° °

## 2017-09-18 NOTE — Telephone Encounter (Signed)
Unable to contact °Sent letter  °

## 2017-09-22 NOTE — Discharge Summary (Signed)
[facility] [place] [facility]   PATIENT NAME: Darren Mendoza    MR#:  [id number]  DATE OF BIRTH:  1956-07-09  DATE OF ADMISSION:  09/08/2017 ADMITTING PHYSICIAN: Ramonita Lab, MD  DATE OF DISCHARGE: 09/09/2017 11:01 AM  PRIMARY CARE PHYSICIAN: Patient, No Pcp Per   ADMISSION DIAGNOSIS:  Nonspecific chest pain [R07.9]  DISCHARGE DIAGNOSIS:  Active Problems:   Chest pain   SECONDARY DIAGNOSIS:   Past Medical History:  Diagnosis Date  . Alcohol abuse   . Anxiety   . CHF (congestive heart failure) (HCC)   . Depression   . Kidney stones   . Opiate abuse, episodic (HCC)   . Opiate withdrawal (HCC) 11/09/2011  . Renal disorder      ADMITTING HISTORY  HISTORY OF PRESENT ILLNESS:  Darren Mendoza  is a 62 y.o. male with a known history of MI as reported by the patient, opiate abuse, anxiety is presenting to the ED with a chief complaint of chest pain. Patient came to the ED with a similar complaint of chest pain ruled out acute MI and he was discharged home. Patient was having recurrent episodes of chest pain in the left side of the chest pain was 10 out of 10 with no radiation, associated with shortness of breath which prompted him to come to the ED again. Patient was given fentanyl, aspirin and nitroglycerin with no relief of pain according to the ED physician. During my examination patient is resting comfortably, denies any chest pain. He reports that he is not truly allergic to morphine gives him some stomach discomfort  HOSPITAL COURSE:   * Recurrent chest pain Patient admitted to telemetry unit and 3 sets of troponin normal. EKG- Nothing acute. Patient had Non cadiac chest pain. Pleuritis. There was also concern if patient was narcotic seeking Consulted cardiology and Dr. Lady Gary advised OP f/u in office with all normal investigative tests in the hospital.  Likely musckuloskeletal. Started ibuprofen  Stable for discharge home  CONSULTS OBTAINED:   Treatment Team:  Dalia Heading, MD  DRUG ALLERGIES:  No Active Allergies  DISCHARGE MEDICATIONS:   Allergies as of 09/09/2017   No Active Allergies     Medication List    STOP taking these medications   ciprofloxacin 500 MG tablet Commonly known as:  CIPRO   diclofenac 50 MG EC tablet Commonly known as:  VOLTAREN     TAKE these medications   acetaminophen 500 MG tablet Commonly known as:  TYLENOL Take 500 mg by mouth every 6 (six) hours as needed for mild pain or moderate pain.   amantadine 100 MG capsule Commonly known as:  SYMMETREL Take 100 mg by mouth every 12 (twelve) hours as needed (for craving).   aspirin 81 MG EC tablet Take 1 tablet (81 mg total) by mouth daily.   cloNIDine 0.1 MG tablet Commonly known as:  CATAPRES Take 0.1 mg by mouth every 4 (four) hours as needed (as needed for opiate withdrawl).   folic acid 1 MG tablet Commonly known as:  FOLVITE Take 1 mg by mouth daily.   hydrOXYzine 25 MG tablet Commonly known as:  ATARAX/VISTARIL Take 25-50 mg by mouth every 6 (six) hours as needed.   ibuprofen 200 MG tablet Commonly known as:  ADVIL Take 1 tablet (200 mg total) by mouth every 8 (eight) hours as needed (pain).   MULTI-DAY Tabs Take 1 tablet by mouth daily.   phenytoin 100 MG ER capsule Commonly known as:  DILANTIN Take 200 mg by mouth 2 (two) times daily.   thiamine 100 MG tablet Take 100 mg by mouth daily.       Today   VITAL SIGNS:  Blood pressure 140/85, pulse 68, temperature 98 F (36.7 C), temperature source Oral, resp. rate 18, weight 104.3 kg (230 lb), SpO2 95 %.  I/O:  No intake or output data in the 24 hours ending 09/22/17 1556  PHYSICAL EXAMINATION:  Physical Exam  GENERAL:  62 y.o.-year-old patient lying in the bed with no acute distress.  LUNGS: Normal breath sounds bilaterally, no wheezing, rales,rhonchi or crepitation. No use of accessory muscles of respiration.  CARDIOVASCULAR: S1, S2 normal. No  murmurs, rubs, or gallops.  ABDOMEN: Soft, non-tender, non-distended. Bowel sounds present. No organomegaly or mass.  NEUROLOGIC: Moves all 4 extremities. PSYCHIATRIC: The patient is alert and oriented x 3.  SKIN: No obvious rash, lesion, or ulcer.   DATA REVIEW:   CBC No results for input(s): WBC, HGB, HCT, PLT in the last 168 hours.  Chemistries  No results for input(s): NA, K, CL, CO2, GLUCOSE, BUN, CREATININE, CALCIUM, MG, AST, ALT, ALKPHOS, BILITOT in the last 168 hours.  Invalid input(s): GFRCGP  Cardiac Enzymes No results for input(s): TROPONINI in the last 168 hours.  Microbiology Results  Results for orders placed or performed during the hospital encounter of 03/26/16  Urine culture     Status: None   Collection Time: 03/26/16  7:35 PM  Result Value Ref Range Status   Specimen Description URINE, RANDOM  Final   Special Requests Normal  Final   Culture NO GROWTH [facility]   Final   Report Status 03/28/2016 FINAL  Final    RADIOLOGY:  No results found.  Follow up with PCP in 1 week.  Management plans discussed with the patient, family and they are in agreement.  CODE STATUS:  Code Status History    Date Active Date Inactive Code Status Order ID Comments User Context   09/08/2017 13:56 09/09/2017 14:06 Full Code [id number]  Ramonita LabGouru, Aruna, MD ED   08/19/2014 14:40 08/24/2014 18:03 Full Code [id number]  Jomarie LongsEappen, Saramma, MD Inpatient   08/19/2014 13:07 08/19/2014 14:40 Full Code [id number]  Jomarie LongsEappen, Saramma, MD Inpatient   08/18/2014 14:31 08/19/2014 13:07 Full Code [id number]  Beau FannyWithrow, John C, FNP Inpatient   08/18/2014 10:41 08/18/2014 14:31 Full Code [id number]  Osie CheeksSofia, Leslie K, PA-C ED   11/06/2011 03:24 11/07/2011 01:55 Full Code [id number]  Schorr, Roma KayserKatherine P ED      TOTAL TIME TAKING CARE OF THIS PATIENT ON DAY OF DISCHARGE: more than 30 minutes.   Molinda BailiffSrikar R Judea Riches M.D on 09/22/2017 at 3:56 PM  Between 7am to 6pm - Pager - [phone]  After 6pm  go to [email or web address] - password [email or web address]  [facility]  Office  [phone]  CC: Primary care physician; Patient, No Pcp Per  Note: This dictation was prepared with Dragon dictation along with smaller phrase technology. Any transcriptional errors that result from this process are unintentional.
